# Patient Record
Sex: Male | Born: 1970 | Race: White | Hispanic: No | Marital: Married | Smoking: Current every day smoker
Health system: Southern US, Community
[De-identification: ages and names within clinical notes are randomized; demographics above are authoritative.]

## PROBLEM LIST (undated history)

## (undated) DIAGNOSIS — I2510 Atherosclerotic heart disease of native coronary artery without angina pectoris: Secondary | ICD-10-CM

## (undated) DIAGNOSIS — I10 Essential (primary) hypertension: Secondary | ICD-10-CM

## (undated) DIAGNOSIS — E669 Obesity, unspecified: Secondary | ICD-10-CM

## (undated) DIAGNOSIS — R7303 Prediabetes: Secondary | ICD-10-CM

## (undated) DIAGNOSIS — K121 Other forms of stomatitis: Secondary | ICD-10-CM

## (undated) HISTORY — PX: LEG SURGERY: SHX1003

---

## 2017-06-22 ENCOUNTER — Emergency Department (HOSPITAL_BASED_OUTPATIENT_CLINIC_OR_DEPARTMENT_OTHER): Payer: Self-pay

## 2017-06-22 ENCOUNTER — Encounter (HOSPITAL_BASED_OUTPATIENT_CLINIC_OR_DEPARTMENT_OTHER): Payer: Self-pay | Admitting: Emergency Medicine

## 2017-06-22 ENCOUNTER — Observation Stay (HOSPITAL_BASED_OUTPATIENT_CLINIC_OR_DEPARTMENT_OTHER)
Admission: EM | Admit: 2017-06-22 | Discharge: 2017-06-24 | Disposition: A | Discharge status: Home / Self Care | Payer: Self-pay | Attending: Cardiovascular Disease | Admitting: Cardiovascular Disease

## 2017-06-22 DIAGNOSIS — I2510 Atherosclerotic heart disease of native coronary artery without angina pectoris: Secondary | ICD-10-CM

## 2017-06-22 DIAGNOSIS — E669 Obesity, unspecified: Secondary | ICD-10-CM | POA: Insufficient documentation

## 2017-06-22 DIAGNOSIS — J9811 Atelectasis: Secondary | ICD-10-CM | POA: Insufficient documentation

## 2017-06-22 DIAGNOSIS — I10 Essential (primary) hypertension: Secondary | ICD-10-CM

## 2017-06-22 DIAGNOSIS — I200 Unstable angina: Secondary | ICD-10-CM

## 2017-06-22 DIAGNOSIS — I25110 Atherosclerotic heart disease of native coronary artery with unstable angina pectoris: Principal | ICD-10-CM | POA: Insufficient documentation

## 2017-06-22 DIAGNOSIS — Z8249 Family history of ischemic heart disease and other diseases of the circulatory system: Secondary | ICD-10-CM | POA: Insufficient documentation

## 2017-06-22 DIAGNOSIS — R079 Chest pain, unspecified: Secondary | ICD-10-CM | POA: Diagnosis present

## 2017-06-22 DIAGNOSIS — Z720 Tobacco use: Secondary | ICD-10-CM

## 2017-06-22 DIAGNOSIS — D751 Secondary polycythemia: Secondary | ICD-10-CM

## 2017-06-22 DIAGNOSIS — Z6835 Body mass index (BMI) 35.0-35.9, adult: Secondary | ICD-10-CM | POA: Insufficient documentation

## 2017-06-22 DIAGNOSIS — F17210 Nicotine dependence, cigarettes, uncomplicated: Secondary | ICD-10-CM | POA: Insufficient documentation

## 2017-06-22 DIAGNOSIS — R7303 Prediabetes: Secondary | ICD-10-CM | POA: Insufficient documentation

## 2017-06-22 DIAGNOSIS — E785 Hyperlipidemia, unspecified: Secondary | ICD-10-CM

## 2017-06-22 DIAGNOSIS — I2584 Coronary atherosclerosis due to calcified coronary lesion: Secondary | ICD-10-CM | POA: Insufficient documentation

## 2017-06-22 HISTORY — DX: Obesity, unspecified: E66.9

## 2017-06-22 HISTORY — DX: Atherosclerotic heart disease of native coronary artery without angina pectoris: I25.10

## 2017-06-22 HISTORY — DX: Other forms of stomatitis: K12.1

## 2017-06-22 HISTORY — DX: Prediabetes: R73.03

## 2017-06-22 HISTORY — DX: Essential (primary) hypertension: I10

## 2017-06-22 LAB — HEPATIC FUNCTION PANEL
ALT: 49 U/L (ref 17–63)
AST: 35 U/L (ref 15–41)
Albumin: 4.3 g/dL (ref 3.5–5.0)
Alkaline Phosphatase: 70 U/L (ref 38–126)
BILIRUBIN DIRECT: 0.2 mg/dL (ref 0.1–0.5)
BILIRUBIN TOTAL: 1.2 mg/dL (ref 0.3–1.2)
Indirect Bilirubin: 1 mg/dL — ABNORMAL HIGH (ref 0.3–0.9)
Total Protein: 8.6 g/dL — ABNORMAL HIGH (ref 6.5–8.1)

## 2017-06-22 LAB — CBC
HEMATOCRIT: 50.7 % (ref 39.0–52.0)
Hemoglobin: 18.5 g/dL — ABNORMAL HIGH (ref 13.0–17.0)
MCH: 35.2 pg — ABNORMAL HIGH (ref 26.0–34.0)
MCHC: 36.5 g/dL — AB (ref 30.0–36.0)
MCV: 96.4 fL (ref 78.0–100.0)
Platelets: 171 10*3/uL (ref 150–400)
RBC: 5.26 MIL/uL (ref 4.22–5.81)
RDW: 12.1 % (ref 11.5–15.5)
WBC: 7.4 10*3/uL (ref 4.0–10.5)

## 2017-06-22 LAB — TROPONIN I: Troponin I: <0.03 ng/mL (ref <0.03)

## 2017-06-22 LAB — BASIC METABOLIC PANEL
Anion gap: 12 (ref 5–15)
BUN: 6 mg/dL (ref 6–20)
CALCIUM: 8.8 mg/dL — AB (ref 8.9–10.3)
CO2: 22 mmol/L (ref 22–32)
Chloride: 100 mmol/L — ABNORMAL LOW (ref 101–111)
Creatinine, Ser: 0.8 mg/dL (ref 0.61–1.24)
GFR calc Af Amer: >60 mL/min (ref >60)
GLUCOSE: 107 mg/dL — AB (ref 65–99)
Potassium: 3.4 mmol/L — ABNORMAL LOW (ref 3.5–5.1)
Sodium: 134 mmol/L — ABNORMAL LOW (ref 135–145)

## 2017-06-22 LAB — LIPASE, BLOOD: Lipase: 35 U/L (ref 11–51)

## 2017-06-22 MED ORDER — ACETAMINOPHEN 325 MG PO TABS: 650.0000 mg | ORAL_TABLET | ORAL | Status: DC | PRN

## 2017-06-22 MED ORDER — MORPHINE SULFATE (PF) 2 MG/ML IV SOLN
2.0000 mg | Freq: Once | INTRAVENOUS | Status: AC
  Administered 2017-06-22: 2 mg via INTRAVENOUS
  Filled 2017-06-22: qty 1

## 2017-06-22 MED ORDER — SODIUM CHLORIDE 0.9 % IV SOLN
INTRAVENOUS | Status: DC
  Administered 2017-06-23: 06:00:00 via INTRAVENOUS

## 2017-06-22 MED ORDER — BOOST / RESOURCE BREEZE PO LIQD
1.0000 | Freq: Three times a day (TID) | ORAL | Status: DC
  Administered 2017-06-23 – 2017-06-24 (×2): 1 via ORAL

## 2017-06-22 MED ORDER — NITROGLYCERIN 0.4 MG SL SUBL: 0.4000 mg | SUBLINGUAL_TABLET | SUBLINGUAL | Status: DC | PRN

## 2017-06-22 MED ORDER — ONDANSETRON HCL 4 MG/2ML IJ SOLN
4.0000 mg | Freq: Once | INTRAMUSCULAR | Status: AC
  Administered 2017-06-22: 4 mg via INTRAVENOUS

## 2017-06-22 MED ORDER — SODIUM CHLORIDE 0.9 % IV SOLN: 250.0000 mL | INTRAVENOUS | Status: DC | PRN

## 2017-06-22 MED ORDER — ENOXAPARIN SODIUM 40 MG/0.4ML ~~LOC~~ SOLN: 40.0000 mg | SUBCUTANEOUS | Status: DC

## 2017-06-22 MED ORDER — ONDANSETRON HCL 4 MG/2ML IJ SOLN
INTRAMUSCULAR | Status: AC
  Administered 2017-06-22: 4 mg via INTRAVENOUS
  Filled 2017-06-22: qty 2

## 2017-06-22 MED ORDER — ASPIRIN 81 MG PO CHEW
324.0000 mg | CHEWABLE_TABLET | Freq: Once | ORAL | Status: AC
  Administered 2017-06-22: 324 mg via ORAL

## 2017-06-22 MED ORDER — SODIUM CHLORIDE 0.9% FLUSH: 3.0000 mL | INTRAVENOUS | Status: DC | PRN

## 2017-06-22 MED ORDER — SODIUM CHLORIDE 0.9% FLUSH
3.0000 mL | Freq: Two times a day (BID) | INTRAVENOUS | Status: DC
  Administered 2017-06-22 – 2017-06-23 (×2): 3 mL via INTRAVENOUS

## 2017-06-22 MED ORDER — POTASSIUM CHLORIDE CRYS ER 20 MEQ PO TBCR
40.0000 meq | EXTENDED_RELEASE_TABLET | Freq: Once | ORAL | Status: AC
  Administered 2017-06-22: 40 meq via ORAL
  Filled 2017-06-22: qty 2

## 2017-06-22 MED ORDER — ASPIRIN 81 MG PO CHEW
CHEWABLE_TABLET | ORAL | Status: AC
  Administered 2017-06-22: 324 mg via ORAL
  Filled 2017-06-22: qty 4

## 2017-06-22 MED ORDER — ASPIRIN 81 MG PO CHEW
81.0000 mg | CHEWABLE_TABLET | ORAL | Status: AC
  Administered 2017-06-23: 81 mg via ORAL
  Filled 2017-06-22: qty 1

## 2017-06-22 MED ORDER — METOPROLOL TARTRATE 12.5 MG HALF TABLET
12.5000 mg | ORAL_TABLET | Freq: Two times a day (BID) | ORAL | Status: DC
  Administered 2017-06-22 – 2017-06-24 (×4): 12.5 mg via ORAL
  Filled 2017-06-22 (×4): qty 1

## 2017-06-22 MED ORDER — ASPIRIN EC 81 MG PO TBEC
81.0000 mg | DELAYED_RELEASE_TABLET | Freq: Every day | ORAL | Status: DC
  Administered 2017-06-24: 81 mg via ORAL
  Filled 2017-06-22: qty 1

## 2017-06-22 MED ORDER — ONDANSETRON HCL 4 MG/2ML IJ SOLN: 4.0000 mg | Freq: Four times a day (QID) | INTRAMUSCULAR | Status: DC | PRN

## 2017-06-22 NOTE — ED Provider Notes (Signed)
[facility] Provider Note   CSN: [id number] Arrival date & time: 06/22/17  1031     History   Chief Complaint Chief Complaint  Patient presents with  . Chest Pain    HPI Edward Powers is a 46 y.o. male with history of HTN who presents today with chief complaint acute onset, constant chest pain which began 20 minutes PTA. Patient states that he awoke with sudden onset, severe chest pain which he describes as "someone sitting on my chest". Pain also radiates down the left arm. No aggravating or alleviating factors noted. Also endorses nausea, lightheadedness, and shortness of breath but denies syncope, cough, or vomiting. States he has no known significant cardiac history him a but smokes 1 pack per day. Denies significant alcohol or recreational drug use. Has not tried anything for his symptoms. Denies numbness, tingling, or weakness. Denies recent travel or surgeries, testosterone replacement therapy, hemoptysis, or prior history of DVT/PE.  The history is provided by the patient.    Past Medical History:  Diagnosis Date  . Borderline diabetes   . Hypertension   . Obesity   . Ulcer (traumatic) of oral mucosa     Patient Active Problem List   Diagnosis Date Noted  . Chest pain 06/22/2017    Past Surgical History:  Procedure Laterality Date  . LEG SURGERY         Home Medications    Prior to Admission medications   Not on File    Family History No family history on file.  Social History Social History  Substance Use Topics  . Smoking status: Current Every Day Smoker    Packs/day: 1.00  . Smokeless tobacco: Never Used  . Alcohol use Yes     Comment: Weekend     Allergies   Patient has no known allergies.   Review of Systems Review of Systems  Constitutional: Negative for chills and fever.  Respiratory: Positive for chest tightness and shortness of breath. Negative for cough.   Cardiovascular: Positive for chest pain. Negative for  palpitations and leg swelling.  Gastrointestinal: Positive for nausea. Negative for abdominal pain and vomiting.  Neurological: Positive for light-headedness. Negative for syncope, weakness and numbness.  All other systems reviewed and are negative.    Physical Exam Updated Vital Signs BP 139/63   Pulse 69   Temp 98.2 F (36.8 C) (Oral)   Resp 20   Ht 5\' 6"  (1.676 m)   Wt 100.7 kg (222 lb)   SpO2 94%   BMI 35.83 kg/m   Physical Exam  Constitutional: He is oriented to person, place, and time. He appears well-developed and well-nourished. No distress.  HENT:  Head: Normocephalic and atraumatic.  Right Ear: External ear normal.  Left Ear: External ear normal.  Eyes: Pupils are equal, round, and reactive to light. Conjunctivae and EOM are normal. Right eye exhibits no discharge. Left eye exhibits no discharge. No scleral icterus.  Neck: Normal range of motion. Neck supple. No JVD present. No tracheal deviation present. No thyromegaly present.  Cardiovascular: Normal rate, regular rhythm, normal heart sounds and intact distal pulses.  Exam reveals no gallop and no friction rub.   No murmur heard. 2+ radial and DP/PT pulses bl, negative Homan's bl   Pulmonary/Chest: Breath sounds normal. He has no wheezes. He has no rales. He exhibits no tenderness.  Globally diminished breath sounds  Abdominal: Soft. Bowel sounds are normal. He exhibits no distension. There is no tenderness.  Musculoskeletal: Normal range of  motion. He exhibits tenderness. He exhibits no deformity.  Generalized tenderness to palpation of the left upper extremity. No deformity, crepitus, erythema, or swelling noted. 5/5 strength of BUE major muscle groups and good grip strength  Neurological: He is alert and oriented to person, place, and time. A sensory deficit is present. No cranial nerve deficit.  Fluent speech, no facial droop, cranial nerves III through XII tested and intact. Altered sensation of the left upper  extremity  Skin: Skin is warm. Capillary refill takes less than 2 seconds. No rash noted. He is diaphoretic. No erythema. No pallor.  Psychiatric: He has a normal mood and affect. His behavior is normal.  Nursing note and vitals reviewed.    ED Treatments / Results  Labs (all labs ordered are listed, but only abnormal results are displayed) Labs Reviewed  BASIC METABOLIC PANEL - Abnormal; Notable for the following:       Result Value   Sodium 134 (*)    Potassium 3.4 (*)    Chloride 100 (*)    Glucose, Bld 107 (*)    Calcium 8.8 (*)    All other components within normal limits  CBC - Abnormal; Notable for the following:    Hemoglobin 18.5 (*)    MCH 35.2 (*)    MCHC 36.5 (*)    All other components within normal limits  HEPATIC FUNCTION PANEL - Abnormal; Notable for the following:    Total Protein 8.6 (*)    Indirect Bilirubin 1.0 (*)    All other components within normal limits  TROPONIN I  TROPONIN I  LIPASE, BLOOD    EKG  EKG Interpretation  Date/Time:  Tuesday June 22 2017 10:38:50 EDT Ventricular Rate:  95 PR Interval:    QRS Duration: 86 QT Interval:  390 QTC Calculation: 491 R Axis:   -28 Text Interpretation:  Sinus rhythm Probable left atrial enlargement Borderline left axis deviation Anteroseptal infarct, old Nonspecific T abnormalities, lateral leads Confirmed by Geoffery Lyons ([id number]) on 06/22/2017 10:41:04 AM       Radiology Dg Chest 2 View  Result Date: 06/22/2017 CLINICAL DATA:  Chest pain.  Shortness of breath . EXAM: CHEST  2 VIEW COMPARISON:  No recent prior . FINDINGS: Mild prominence of the anterior mediastinum noted on lateral view, this may be related to patient rotation on lateral view. Low lung volumes with mild bibasilar subsegmental atelectasis. Mild bibasilar infiltrates cannot be excluded. No pleural effusion or pneumothorax. Heart size normal. Degenerative changes thoracic spine . IMPRESSION: 1. Low lung volumes with mild bibasilar  subsegmental atelectasis. Mild bibasilar infiltrates cannot be excluded . 2. Mild prominence of the anterior mediastinum noted on lateral view. This may be related to patient rotation on lateral view. Repeat PA and lateral chest x-ray suggested. Electronically Signed   By: Maisie Fus  Register   On: 06/22/2017 11:23    Procedures Procedures (including critical care time)  Medications Ordered in ED Medications  aspirin chewable tablet 324 mg (324 mg Oral Given 06/22/17 1058)  morphine 2 MG/ML injection 2 mg (2 mg Intravenous Given 06/22/17 1102)  ondansetron (ZOFRAN) injection 4 mg (4 mg Intravenous Given 06/22/17 1210)  morphine 2 MG/ML injection 2 mg (2 mg Intravenous Given 06/22/17 1243)     Initial Impression / Assessment and Plan / ED Course  I have reviewed the triage vital signs and the nursing notes.  Pertinent labs & imaging results that were available during my care of the patient were reviewed by me and considered  in my medical decision making (see chart for details).     Patient with acute onset of chest pain with radiation down the left arm 20 minutes PTA. Afebrile, vital signs are stable. EKG shows nonspecific T-wave abnormalities in the lateral leads with no comparison. Chest x-ray reviewed by me shows low lung volumes with mild bibasilar subsegmental atelectasis. Troponin negative, no leukocytosis and no significant electrolyte abnormalities. Low suspicion of pneumonia, PE, bronchitis, or pericarditis. Pain managed while in the ED with ASA, morphine and Zofran. Given suspicious history, T-wave abnormalities, and HEART score of 5, will consult cardiology. Spoke with cardiologist Dr. Molli Posey will assume care of the patient, and bring him in to the hospital for further evaluation. He is stable for telemetry bed.  Final Clinical Impressions(s) / ED Diagnoses   Final diagnoses:  Chest pain, unspecified type    New Prescriptions New Prescriptions   No medications on file       Bennye Alm 06/22/17 1551    Geoffery Lyons, MD 06/24/17 [phone]

## 2017-06-22 NOTE — ED Triage Notes (Addendum)
Pt reports chest pain upon waking pta. States he was sweaty and felt like he was going to pass out. States "it feels like someone sitting on me" and pain also goes down left arm

## 2017-06-22 NOTE — ED Notes (Signed)
Family at bedside. 

## 2017-06-22 NOTE — H&P (Signed)
History & Physical    Patient ID: Edward Powers MRN: [id number], DOB/AGE: Jun 08, 1971   Admit date: 06/22/2017  Primary Physician: Patient, No Pcp Per Primary Cardiologist: New to [facility] - Dr. Royann Shivers   History of Present Illness    Edward Powers is a 46 y.o. male with past medical history of HTN and tobacco use who presented to [facility] on 06/22/2017 for evaluation of chest pain.   The patient reports awaking from sleep around 0900 due to sudden onset left-sided chest pain which radiated down his left arm. Says it felt like something was sitting on his chest. Developed associated dyspnea and diaphoresis at that time. Upon arrival to [facility], he was given SL NTG and IV Morphine with improvement of his symptoms. Reports a mild pain at this time which is reproducible on palpation and says this clearly feels different from what he experienced this morning.   He is not overly active at baseline but reports performing household chores over the past few weeks without any anginal symptoms. Did have strep throat approximately one month ago which was diagnosed with a swab and appropriately treated with antibiotic therapy.   He denies any known history of CAD or prior MI's. Reports BP is always "borderline-high" but de does not take medications for this. Unsure of any diagnosis of HLD or Type 2 DM. Reports his paternal uncle had a massive MI in his 52's. The patient does smoke 1 ppd and has done so for 30+ years. Consumes 3-4 beers a few nights per week, mostly on weekends. He denies any recreational drug use. 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   Initial labs show WBC of 7.4. Hgb 18.5, platelets 171. Na+ 134, K+ 3.4, creatinine 0.80. LFT's are within normal limits. Initial and delta troponin have been negative. CXR with low lung volumes with bibasilar atelectasis, infiltrates not excluded. Mild prominence of the anterior mediastinum noted. EKG shows NSR, HR 95, with TWI  along AVL and V6  (no prior tracings available for comparison).   Past Medical History    Past Medical History:  Diagnosis Date  . Borderline diabetes   . Hypertension   . Obesity   . Ulcer (traumatic) of oral mucosa     Past Surgical History:  Procedure Laterality Date  . LEG SURGERY       Allergies  No Known Allergies   Home Medications    Prior to Admission medications   Not on File    Family History    Family History  Problem Relation Age of Onset  . Diabetes Mellitus II Father   . CAD Paternal Uncle        Initial MI in his 71's    Social History    Social History   Social History  . Marital status: Married    Spouse name: N/A  . Number of children: N/A  . Years of education: N/A   Occupational History  . Not on file.   Social History Main Topics  . Smoking status: Current Every Day Smoker    Packs/day: 1.00    Years: 30.00  . Smokeless tobacco: Never Used  . Alcohol use 12.0 oz/week    20 Cans of beer per week  . Drug use: No  . Sexual activity: Not on file   Other Topics Concern  . Not on file   Social History Narrative  . No narrative on file  Review of Systems    General:  No chills, fever, night sweats or weight changes.  Cardiovascular:  No dyspnea on exertion, edema, orthopnea, palpitations, paroxysmal nocturnal dyspnea. Positive for chest pain.  Dermatological: No rash, lesions/masses Respiratory: No cough, dyspnea Urologic: No hematuria, dysuria Abdominal:   No nausea, vomiting, diarrhea, bright red blood per rectum, melena, or hematemesis Neurologic:  No visual changes, wkns, changes in mental status. All other systems reviewed and are otherwise negative except as noted above.  Physical Exam    Blood pressure (!) 142/72, pulse 65, temperature 98.4 F (36.9 C), temperature source Oral, resp. rate 18, height 5\' 6"  (1.676 m), weight 206 lb 4.8 oz (93.6 kg), SpO2 98 %.  General: Well developed, well nourished Caucasian  male appearing in no acute distress. Head: Normocephalic, atraumatic, sclera non-icteric, no xanthomas, nares are without discharge. Neck: No carotid bruits. JVD not elevated.  Lungs: Respirations regular and unlabored, without wheezes or rales.  Heart: Regular rate and rhythm. No S3 or S4.  No murmur, no rubs, or gallops appreciated. Abdomen: Soft, non-tender, non-distended with normoactive bowel sounds. No hepatomegaly. No rebound/guarding. No obvious abdominal masses. Msk:  Strength and tone appear normal for age. No joint deformities or effusions. Extremities: No clubbing or cyanosis. No lower extremity edema.  Distal pedal pulses are 2+ bilaterally. Neuro: Alert and oriented X 3. Moves all extremities spontaneously. No focal deficits noted. Psych:  Responds to questions appropriately with a normal affect. Skin: No rashes or lesions noted  Labs    Troponin (Point of Care Test) No results for input(s): TROPIPOC in the last 72 hours.  Recent Labs  06/22/17 1040 06/22/17 1344  TROPONINI <0.03 <0.03   Lab Results  Component Value Date   WBC 7.4 06/22/2017   HGB 18.5 (H) 06/22/2017   HCT 50.7 06/22/2017   MCV 96.4 06/22/2017   PLT 171 06/22/2017     Recent Labs Lab 06/22/17 1040  NA 134*  K 3.4*  CL 100*  CO2 22  BUN 6  CREATININE 0.80  CALCIUM 8.8*  PROT 8.6*  BILITOT 1.2  ALKPHOS 70  ALT 49  AST 35  GLUCOSE 107*   No results found for: CHOL, HDL, LDLCALC, TRIG No results found for: DDIMER  No results found for: BNP No results found for: PROBNP No results for input(s): INR in the last 72 hours.    Radiology Studies    Dg Chest 2 View  Result Date: 06/22/2017 CLINICAL DATA:  Chest pain.  Shortness of breath . EXAM: CHEST  2 VIEW COMPARISON:  No recent prior . FINDINGS: Mild prominence of the anterior mediastinum noted on lateral view, this may be related to patient rotation on lateral view. Low lung volumes with mild bibasilar subsegmental atelectasis. Mild  bibasilar infiltrates cannot be excluded. No pleural effusion or pneumothorax. Heart size normal. Degenerative changes thoracic spine . IMPRESSION: 1. Low lung volumes with mild bibasilar subsegmental atelectasis. Mild bibasilar infiltrates cannot be excluded . 2. Mild prominence of the anterior mediastinum noted on lateral view. This may be related to patient rotation on lateral view. Repeat PA and lateral chest x-ray suggested. Electronically Signed   By: Maisie Fus  Register   On: 06/22/2017 11:23    EKG & Cardiac Imaging    EKG:  NSR, HR 95, with TWI along AVL and V6  (no prior tracings available for comparison) - Personally Reviewed  ECHOCARDIOGRAM: None on File  Assessment & Plan   1. Chest Pain with Mixed Typical and  Atypical Features - was awoke from sleep around 0900 this morning due to sudden onset left-sided chest pain which radiated down his left arm. Says it felt like something was sitting on his chest and he reports associated dyspnea and diaphoresis.  - reports mild pain at this time which is clearly reproducible on palpation but says this is markedly different from his symptoms this morning.  - cardiac risk factors include HTN, tobacco use, and family history of CAD. 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  - Initial and delta troponin have been negative. EKG shows NSR, HR 95, with TWI along AVL and V6  (no prior tracings available for comparison).  - Discussed with Dr. Royann Shiversroitoru and with his presenting symptoms and abnormal EKG, a cardiac catheterization would be most beneficial. The patient understands that risks include but are not limited to stroke (1 in 1000), death (1 in 1000), kidney failure [usually temporary] (1 in 500), bleeding (1 in 200), allergic reaction [possibly serious] (1 in 200). Clear liquid breakfast in AM and then NPO. Has been placed on the add-on board.   2. HTN - BP elevated at  142/72 on most recent check.  - started on Lopressor 12.5mg  BID. Consider switching to once daily medication such as Amlodipine for improved compliance if no CAD is noted.   3. Tobacco Use - cessation advised.    Signed, Ellsworth LennoxBrittany M Strader, PA-C 06/22/2017, 6:54 PM Pager: [phone]  I have seen and examined the patient along with Ellsworth LennoxBrittany M Strader, PA-C.  I have reviewed the chart, notes and new data.  I agree with PA's note.  Key new complaints: symptoms are strongly suggestive of unstable angina; also describes a separate discomfort in both arms, c/w cervical radiculopathy. He is a loud and long time snorer, but denies daytime hypersomnolence. Key examination changes: obese, crowded airway, no carotid bruits, normal distal pulses, RRR, questionable S4, no murmurs Key new findings / data: horizontal ST depression and T wave inversion in lateral leads without other signs of LVH., normal cardiac enzymes, Hgb 18.5  PLAN: Discussed options for invasive versus noninvasive evaluation for CAD and discussed pros and cons. He prefers direct angiography and I think this is reasonable. This procedure and possible PCI-stent has been fully reviewed with the patient and written informed consent has been obtained. Will need to explore possible diagnoses of COPD or OSA as explanations for his polycythemia. Beta blockers for HBP. Review importance of smoking cessation and weight loss. Check lipids.   Thurmon FairMihai Nadean Montanaro, MD, [facility] [phone] 06/22/2017, 7:11 PM

## 2017-06-22 NOTE — ED Notes (Signed)
ekg given to Dr. Judd Lienelo to review

## 2017-06-22 NOTE — ED Notes (Signed)
Patient denies pain and is resting comfortably.  

## 2017-06-22 NOTE — ED Notes (Signed)
ED Provider at bedside. 

## 2017-06-23 ENCOUNTER — Encounter (HOSPITAL_COMMUNITY)
Admission: EM | Disposition: A | Discharge status: Home / Self Care | Payer: Self-pay | Source: Home / Self Care | Attending: Emergency Medicine

## 2017-06-23 DIAGNOSIS — F17200 Nicotine dependence, unspecified, uncomplicated: Secondary | ICD-10-CM

## 2017-06-23 DIAGNOSIS — E786 Lipoprotein deficiency: Secondary | ICD-10-CM

## 2017-06-23 DIAGNOSIS — I200 Unstable angina: Secondary | ICD-10-CM

## 2017-06-23 DIAGNOSIS — I25110 Atherosclerotic heart disease of native coronary artery with unstable angina pectoris: Secondary | ICD-10-CM

## 2017-06-23 DIAGNOSIS — I10 Essential (primary) hypertension: Secondary | ICD-10-CM

## 2017-06-23 HISTORY — PX: LEFT HEART CATH AND CORONARY ANGIOGRAPHY: CATH118249

## 2017-06-23 LAB — LIPID PANEL
CHOLESTEROL: 130 mg/dL (ref 0–200)
HDL: 31 mg/dL — AB (ref >40)
LDL CALC: 68 mg/dL (ref 0–99)
TRIGLYCERIDES: 157 mg/dL — AB (ref <150)
Total CHOL/HDL Ratio: 4.2 RATIO
VLDL: 31 mg/dL (ref 0–40)

## 2017-06-23 LAB — BASIC METABOLIC PANEL
Anion gap: 7 (ref 5–15)
BUN: 10 mg/dL (ref 6–20)
CALCIUM: 8.9 mg/dL (ref 8.9–10.3)
CO2: 26 mmol/L (ref 22–32)
CREATININE: 0.98 mg/dL (ref 0.61–1.24)
Chloride: 102 mmol/L (ref 101–111)
GFR calc Af Amer: >60 mL/min (ref >60)
GFR calc non Af Amer: >60 mL/min (ref >60)
GLUCOSE: 130 mg/dL — AB (ref 65–99)
Potassium: 3.8 mmol/L (ref 3.5–5.1)
Sodium: 135 mmol/L (ref 135–145)

## 2017-06-23 LAB — PROTIME-INR
INR: 1.07
Prothrombin Time: 13.9 seconds (ref 11.4–15.2)

## 2017-06-23 LAB — POCT ACTIVATED CLOTTING TIME: ACTIVATED CLOTTING TIME: 257 s

## 2017-06-23 LAB — TROPONIN I: Troponin I: <0.03 ng/mL (ref <0.03)

## 2017-06-23 SURGERY — LEFT HEART CATH AND CORONARY ANGIOGRAPHY
Anesthesia: LOCAL

## 2017-06-23 MED ORDER — ADENOSINE (DIAGNOSTIC) 140MCG/KG/MIN
INTRAVENOUS | Status: AC | PRN
Start: 2017-06-23 — End: 2017-06-23
  Administered 2017-06-23: 140 ug/kg/min via INTRAVENOUS

## 2017-06-23 MED ORDER — FENTANYL CITRATE (PF) 100 MCG/2ML IJ SOLN
INTRAMUSCULAR | Status: DC | PRN
  Administered 2017-06-23: 50 ug via INTRAVENOUS

## 2017-06-23 MED ORDER — VERAPAMIL HCL 2.5 MG/ML IV SOLN
INTRAVENOUS | Status: DC | PRN
  Administered 2017-06-23: 10 mL via INTRA_ARTERIAL

## 2017-06-23 MED ORDER — SODIUM CHLORIDE 0.9 % IV SOLN: INTRAVENOUS | Status: AC

## 2017-06-23 MED ORDER — SODIUM CHLORIDE 0.9 % IV SOLN: 250.0000 mL | INTRAVENOUS | Status: DC | PRN

## 2017-06-23 MED ORDER — MIDAZOLAM HCL 2 MG/2ML IJ SOLN
INTRAMUSCULAR | Status: AC
  Filled 2017-06-23: qty 2

## 2017-06-23 MED ORDER — LIDOCAINE HCL (PF) 1 % IJ SOLN
INTRAMUSCULAR | Status: DC | PRN
  Administered 2017-06-23: 4 mL via SUBCUTANEOUS

## 2017-06-23 MED ORDER — MIDAZOLAM HCL 2 MG/2ML IJ SOLN
INTRAMUSCULAR | Status: DC | PRN
  Administered 2017-06-23: 1 mg via INTRAVENOUS

## 2017-06-23 MED ORDER — LIDOCAINE HCL (PF) 1 % IJ SOLN
INTRAMUSCULAR | Status: AC
  Filled 2017-06-23: qty 30

## 2017-06-23 MED ORDER — ATORVASTATIN CALCIUM 40 MG PO TABS
40.0000 mg | ORAL_TABLET | Freq: Every day | ORAL | Status: DC
  Administered 2017-06-23: 40 mg via ORAL
  Filled 2017-06-23: qty 1

## 2017-06-23 MED ORDER — FENTANYL CITRATE (PF) 100 MCG/2ML IJ SOLN
INTRAMUSCULAR | Status: AC
  Filled 2017-06-23: qty 2

## 2017-06-23 MED ORDER — IOPAMIDOL (ISOVUE-370) INJECTION 76%
INTRAVENOUS | Status: DC | PRN
  Administered 2017-06-23: 100 mL via INTRA_ARTERIAL

## 2017-06-23 MED ORDER — VERAPAMIL HCL 2.5 MG/ML IV SOLN
INTRAVENOUS | Status: AC
  Filled 2017-06-23: qty 2

## 2017-06-23 MED ORDER — IOPAMIDOL (ISOVUE-370) INJECTION 76%
INTRAVENOUS | Status: AC
  Filled 2017-06-23: qty 100

## 2017-06-23 MED ORDER — HEPARIN (PORCINE) IN NACL 2-0.9 UNIT/ML-% IJ SOLN
INTRAMUSCULAR | Status: AC
  Filled 2017-06-23: qty 1000

## 2017-06-23 MED ORDER — HEPARIN (PORCINE) IN NACL 2-0.9 UNIT/ML-% IJ SOLN
INTRAMUSCULAR | Status: AC | PRN
  Administered 2017-06-23: 1000 mL

## 2017-06-23 MED ORDER — ADENOSINE 12 MG/4ML IV SOLN
INTRAVENOUS | Status: AC
  Filled 2017-06-23: qty 16

## 2017-06-23 MED ORDER — SODIUM CHLORIDE 0.9% FLUSH: 3.0000 mL | INTRAVENOUS | Status: DC | PRN

## 2017-06-23 MED ORDER — MORPHINE SULFATE (PF) 10 MG/ML IV SOLN: 2.0000 mg | Freq: Once | INTRAVENOUS | Status: AC

## 2017-06-23 MED ORDER — HEPARIN SODIUM (PORCINE) 1000 UNIT/ML IJ SOLN
INTRAMUSCULAR | Status: DC | PRN
  Administered 2017-06-23: 5000 [IU] via INTRAVENOUS
  Administered 2017-06-23: 3000 [IU] via INTRAVENOUS

## 2017-06-23 MED ORDER — MORPHINE SULFATE (PF) 2 MG/ML IV SOLN
INTRAVENOUS | Status: AC
  Administered 2017-06-23: 2 mg via INTRAVENOUS
  Filled 2017-06-23: qty 1

## 2017-06-23 MED ORDER — HEPARIN SODIUM (PORCINE) 1000 UNIT/ML IJ SOLN
INTRAMUSCULAR | Status: AC
  Filled 2017-06-23: qty 1

## 2017-06-23 MED ORDER — SODIUM CHLORIDE 0.9% FLUSH
3.0000 mL | Freq: Two times a day (BID) | INTRAVENOUS | Status: DC
  Administered 2017-06-23 – 2017-06-24 (×2): 3 mL via INTRAVENOUS

## 2017-06-23 SURGICAL SUPPLY — 11 items
CATH OPTITORQUE JACKY 4.0 5F (CATHETERS) ×2 IMPLANT
CATH VISTA GUIDE 6FR JR4 (CATHETERS) ×2 IMPLANT
DEVICE RAD COMP TR BAND LRG (VASCULAR PRODUCTS) ×2 IMPLANT
GLIDESHEATH SLEND SS 6F .021 (SHEATH) ×2 IMPLANT
GUIDEWIRE INQWIRE 1.5J.035X260 (WIRE) ×1 IMPLANT
GUIDEWIRE PRESSURE COMET II (WIRE) ×2 IMPLANT
INQWIRE 1.5J .035X260CM (WIRE) ×2
KIT HEART LEFT (KITS) ×2 IMPLANT
PACK CARDIAC CATHETERIZATION (CUSTOM PROCEDURE TRAY) ×2 IMPLANT
TRANSDUCER W/STOPCOCK (MISCELLANEOUS) ×2 IMPLANT
TUBING CIL FLEX 10 FLL-RA (TUBING) ×2 IMPLANT

## 2017-06-23 NOTE — Progress Notes (Signed)
Nutrition Brief Note  Patient identified on the Malnutrition Screening Tool (MST) Report  Wt Readings from Last 15 Encounters:  06/23/17 207 lb 9.6 oz (94.2 kg)   Pt acknowledges wt loss but reports he began losing wt after making dietary changes (pt stopped drinking regular sodas every day and switched to water)  Body mass index is 33.51 kg/m. Patient meets criteria for obesity unspecified based on current BMI.   Current diet order is NPO for test, pt with very good appetite, eating well at home PTA. patient is consuming approximately 100% of meals at this time. Labs and medications reviewed.   No nutrition interventions warranted at this time. If nutrition issues arise, please consult RD.   Romelle Starcherate Kinlie Janice MS, RD, LDN [phone] Pager  [phone] Weekend/On-Call Pager

## 2017-06-23 NOTE — Progress Notes (Signed)
TR band removed, no complications and tegaderm/gauze placed.

## 2017-06-23 NOTE — Research (Signed)
OPTIMIZE Research Study Informed Consent   Subject Name: Edward Powers  Subject met inclusion and exclusion criteria.  The informed consent form, study requirements and expectations were reviewed with the subject and questions and concerns were addressed prior to the signing of the consent form.  The subject verbalized understanding of the trail requirements.  The subject agreed to participate in the OPTIMIZE trial and signed the informed consent.  The informed consent was obtained prior to performance of any protocol-specific procedures for the subject.  A copy of the signed informed consent was given to the subject and a copy was placed in the subject's medical record.  Blossom Hoops 06/23/2017, 3:59 PM

## 2017-06-23 NOTE — Progress Notes (Signed)
Progress Note  Patient Name: Edward Powers Date of Encounter: 06/23/2017  Primary Cardiologist: New (Tristram Milian)  Subjective   No angina overnight. Enzymes remain normal.  Inpatient Medications    Scheduled Meds: . aspirin EC  81 mg Oral Daily  . enoxaparin (LOVENOX) injection  40 mg Subcutaneous Q24H  . feeding supplement  1 Container Oral TID BM  . metoprolol tartrate  12.5 mg Oral BID  . sodium chloride flush  3 mL Intravenous Q12H   Continuous Infusions: . sodium chloride    . sodium chloride 100 mL/hr at 06/23/17 0538   PRN Meds: sodium chloride, acetaminophen, nitroGLYCERIN, ondansetron (ZOFRAN) IV, sodium chloride flush   Vital Signs    Vitals:   06/22/17 1826 06/22/17 2204 06/23/17 0033 06/23/17 0437  BP: (!) 142/72 115/66 (!) 127/56 124/62  Pulse: 65 66 (!) 55 (!) 58  Resp: 18  20 20   Temp: 98.4 F (36.9 C)  97.7 F (36.5 C) 98.1 F (36.7 C)  TempSrc: Oral  Oral Oral  SpO2: 98%  97% 98%  Weight: 206 lb 4.8 oz (93.6 kg)   207 lb 9.6 oz (94.2 kg)  Height: 5\' 6"  (1.676 m)       Intake/Output Summary (Last 24 hours) at 06/23/17 0830 Last data filed at 06/23/17 0600  Gross per 24 hour  Intake           719.67 ml  Output                0 ml  Net           719.67 ml   Filed Weights   06/22/17 1040 06/22/17 1826 06/23/17 0437  Weight: 222 lb (100.7 kg) 206 lb 4.8 oz (93.6 kg) 207 lb 9.6 oz (94.2 kg)    Telemetry    NSR - Personally Reviewed  ECG    No new tracing - Personally Reviewed  Physical Exam  Alert, comfortable. Obese GEN: No acute distress.   Neck: No JVD Cardiac: RRR, no murmurs, rubs, or gallops.  Respiratory: Clear to auscultation bilaterally. GI: Soft, nontender, non-distended  MS: No edema; No deformity. Neuro:  Nonfocal  Psych: Normal affect   Labs    Chemistry Recent Labs Lab 06/22/17 1040 06/23/17 0333  NA 134* 135  K 3.4* 3.8  CL 100* 102  CO2 22 26  GLUCOSE 107* 130*  BUN 6 10  CREATININE 0.80 0.98  CALCIUM  8.8* 8.9  PROT 8.6*  --   ALBUMIN 4.3  --   AST 35  --   ALT 49  --   ALKPHOS 70  --   BILITOT 1.2  --   GFRNONAA >60 >60  GFRAA >60 >60  ANIONGAP 12 7     Hematology Recent Labs Lab 06/22/17 1040  WBC 7.4  RBC 5.26  HGB 18.5*  HCT 50.7  MCV 96.4  MCH 35.2*  MCHC 36.5*  RDW 12.1  PLT 171    Cardiac Enzymes Recent Labs Lab 06/22/17 1040 06/22/17 1344 06/23/17 0333  TROPONINI <0.03 <0.03 <0.03   No results for input(s): TROPIPOC in the last 168 hours.   BNPNo results for input(s): BNP, PROBNP in the last 168 hours.   DDimer No results for input(s): DDIMER in the last 168 hours.   Radiology    Dg Chest 2 View  Result Date: 06/22/2017 CLINICAL DATA:  Chest pain.  Shortness of breath . EXAM: CHEST  2 VIEW COMPARISON:  No recent prior . FINDINGS: Mild prominence  of the anterior mediastinum noted on lateral view, this may be related to patient rotation on lateral view. Low lung volumes with mild bibasilar subsegmental atelectasis. Mild bibasilar infiltrates cannot be excluded. No pleural effusion or pneumothorax. Heart size normal. Degenerative changes thoracic spine . IMPRESSION: 1. Low lung volumes with mild bibasilar subsegmental atelectasis. Mild bibasilar infiltrates cannot be excluded . 2. Mild prominence of the anterior mediastinum noted on lateral view. This may be related to patient rotation on lateral view. Repeat PA and lateral chest x-ray suggested. Electronically Signed   By: Maisie Fus  Register   On: 06/22/2017 11:23    Patient Profile     46 y.o. male smoker with HTN, with severe chest discomfort at rest, compatible with unstable angina and ECG ST-T changes, but normal biomarkers. Strong family history of early CAD and premature death from heart disease.  Assessment & Plan    1. Unstable angina: for heart cath and possible PCI today. This procedure has been fully reviewed with the patient and written informed consent has been obtained. 2. HTN: better  control. 3. Tobacco: will work on convincing him to quit. 4. Polycythemia: after coronary issues are addressed, workup should include PFTs and a sleep study. 5. Low HDL: other lipid parameters are not bad. Focus on weight loss, more physical exercise and improved diet, reduced intake of carbs.  Signed, Thurmon Fair, MD  06/23/2017, 8:30 AM

## 2017-06-23 NOTE — Progress Notes (Signed)
No complaints of chest pain

## 2017-06-23 NOTE — H&P (View-Only) (Signed)
Progress Note  Patient Name: Edward CuriaMark Powers Date of Encounter: 06/23/2017  Primary Cardiologist: New (Meggan Dhaliwal)  Subjective   No angina overnight. Enzymes remain normal.  Inpatient Medications    Scheduled Meds: . aspirin EC  81 mg Oral Daily  . enoxaparin (LOVENOX) injection  40 mg Subcutaneous Q24H  . feeding supplement  1 Container Oral TID BM  . metoprolol tartrate  12.5 mg Oral BID  . sodium chloride flush  3 mL Intravenous Q12H   Continuous Infusions: . sodium chloride    . sodium chloride 100 mL/hr at 06/23/17 0538   PRN Meds: sodium chloride, acetaminophen, nitroGLYCERIN, ondansetron (ZOFRAN) IV, sodium chloride flush   Vital Signs    Vitals:   06/22/17 1826 06/22/17 2204 06/23/17 0033 06/23/17 0437  BP: (!) 142/72 115/66 (!) 127/56 124/62  Pulse: 65 66 (!) 55 (!) 58  Resp: 18  20 20   Temp: 98.4 F (36.9 C)  97.7 F (36.5 C) 98.1 F (36.7 C)  TempSrc: Oral  Oral Oral  SpO2: 98%  97% 98%  Weight: 206 lb 4.8 oz (93.6 kg)   207 lb 9.6 oz (94.2 kg)  Height: 5\' 6"  (1.676 m)       Intake/Output Summary (Last 24 hours) at 06/23/17 0830 Last data filed at 06/23/17 0600  Gross per 24 hour  Intake           719.67 ml  Output                0 ml  Net           719.67 ml   Filed Weights   06/22/17 1040 06/22/17 1826 06/23/17 0437  Weight: 222 lb (100.7 kg) 206 lb 4.8 oz (93.6 kg) 207 lb 9.6 oz (94.2 kg)    Telemetry    NSR - Personally Reviewed  ECG    No new tracing - Personally Reviewed  Physical Exam  Alert, comfortable. Obese GEN: No acute distress.   Neck: No JVD Cardiac: RRR, no murmurs, rubs, or gallops.  Respiratory: Clear to auscultation bilaterally. GI: Soft, nontender, non-distended  MS: No edema; No deformity. Neuro:  Nonfocal  Psych: Normal affect   Labs    Chemistry Recent Labs Lab 06/22/17 1040 06/23/17 0333  NA 134* 135  K 3.4* 3.8  CL 100* 102  CO2 22 26  GLUCOSE 107* 130*  BUN 6 10  CREATININE 0.80 0.98  CALCIUM  8.8* 8.9  PROT 8.6*  --   ALBUMIN 4.3  --   AST 35  --   ALT 49  --   ALKPHOS 70  --   BILITOT 1.2  --   GFRNONAA >60 >60  GFRAA >60 >60  ANIONGAP 12 7     Hematology Recent Labs Lab 06/22/17 1040  WBC 7.4  RBC 5.26  HGB 18.5*  HCT 50.7  MCV 96.4  MCH 35.2*  MCHC 36.5*  RDW 12.1  PLT 171    Cardiac Enzymes Recent Labs Lab 06/22/17 1040 06/22/17 1344 06/23/17 0333  TROPONINI <0.03 <0.03 <0.03   No results for input(s): TROPIPOC in the last 168 hours.   BNPNo results for input(s): BNP, PROBNP in the last 168 hours.   DDimer No results for input(s): DDIMER in the last 168 hours.   Radiology    Dg Chest 2 View  Result Date: 06/22/2017 CLINICAL DATA:  Chest pain.  Shortness of breath . EXAM: CHEST  2 VIEW COMPARISON:  No recent prior . FINDINGS: Mild prominence  of the anterior mediastinum noted on lateral view, this may be related to patient rotation on lateral view. Low lung volumes with mild bibasilar subsegmental atelectasis. Mild bibasilar infiltrates cannot be excluded. No pleural effusion or pneumothorax. Heart size normal. Degenerative changes thoracic spine . IMPRESSION: 1. Low lung volumes with mild bibasilar subsegmental atelectasis. Mild bibasilar infiltrates cannot be excluded . 2. Mild prominence of the anterior mediastinum noted on lateral view. This may be related to patient rotation on lateral view. Repeat PA and lateral chest x-ray suggested. Electronically Signed   By: Maisie Fus  Register   On: 06/22/2017 11:23    Patient Profile     46 y.o. male smoker with HTN, with severe chest discomfort at rest, compatible with unstable angina and ECG ST-T changes, but normal biomarkers. Strong family history of early CAD and premature death from heart disease.  Assessment & Plan    1. Unstable angina: for heart cath and possible PCI today. This procedure has been fully reviewed with the patient and written informed consent has been obtained. 2. HTN: better  control. 3. Tobacco: will work on convincing him to quit. 4. Polycythemia: after coronary issues are addressed, workup should include PFTs and a sleep study. 5. Low HDL: other lipid parameters are not bad. Focus on weight loss, more physical exercise and improved diet, reduced intake of carbs.  Signed, Thurmon Fair, MD  06/23/2017, 8:30 AM

## 2017-06-23 NOTE — Progress Notes (Signed)
Patient sleeps and snores very loudly. Family at bedside. Patient is aware of procedure tomorrow, wants graham crackers before his clear liquid diet starts at 0500.

## 2017-06-23 NOTE — Interval H&P Note (Signed)
Cath Lab Visit (complete for each Cath Lab visit)  Clinical Evaluation Leading to the Procedure:   ACS: Yes.    Non-ACS:  n/a   History and Physical Interval Note:  06/23/2017 4:06 PM  Simone CuriaMark Encalade  has presented today for surgery, with the diagnosis of unstable angina  The various methods of treatment have been discussed with the patient and family. After consideration of risks, benefits and other options for treatment, the patient has consented to  Procedure(s): Left Heart Cath and Coronary Angiography (N/A) as a surgical intervention .  The patient's history has been reviewed, patient examined, no change in status, stable for surgery.  I have reviewed the patient's chart and labs.  Questions were answered to the patient's satisfaction.     Lorine BearsMuhammad Arida

## 2017-06-23 NOTE — Progress Notes (Signed)
Patient and wife watching cardiac cath/PCI video.

## 2017-06-23 NOTE — Progress Notes (Signed)
Patient back from cath, VSS.  Upon RN reapplying telemetry box patient began c/o pain in his right to mid chest rated at a 3.  States this is worse than the pain he came in with. Notified PA for cards, orders received.

## 2017-06-23 NOTE — Plan of Care (Signed)
Problem: Tissue Perfusion: Goal: Risk factors for ineffective tissue perfusion will decrease Outcome: Progressing Maintains oxygen saturations high 90's on room air

## 2017-06-23 NOTE — Progress Notes (Signed)
Patient denies any further chest pain at this time, VSS.

## 2017-06-24 ENCOUNTER — Encounter (HOSPITAL_COMMUNITY): Payer: Self-pay | Admitting: Cardiovascular Disease

## 2017-06-24 ENCOUNTER — Other Ambulatory Visit: Payer: Self-pay | Admitting: Cardiology

## 2017-06-24 DIAGNOSIS — I10 Essential (primary) hypertension: Secondary | ICD-10-CM

## 2017-06-24 DIAGNOSIS — D751 Secondary polycythemia: Secondary | ICD-10-CM

## 2017-06-24 DIAGNOSIS — I25708 Atherosclerosis of coronary artery bypass graft(s), unspecified, with other forms of angina pectoris: Secondary | ICD-10-CM

## 2017-06-24 DIAGNOSIS — E785 Hyperlipidemia, unspecified: Secondary | ICD-10-CM

## 2017-06-24 DIAGNOSIS — I2510 Atherosclerotic heart disease of native coronary artery without angina pectoris: Secondary | ICD-10-CM

## 2017-06-24 DIAGNOSIS — Z720 Tobacco use: Secondary | ICD-10-CM

## 2017-06-24 DIAGNOSIS — I2583 Coronary atherosclerosis due to lipid rich plaque: Principal | ICD-10-CM

## 2017-06-24 LAB — HEMOGLOBIN A1C
Hgb A1c MFr Bld: 6.2 % — ABNORMAL HIGH (ref 4.8–5.6)
Mean Plasma Glucose: 131 mg/dL

## 2017-06-24 LAB — HIV ANTIBODY (ROUTINE TESTING W REFLEX): HIV SCREEN 4TH GENERATION: NONREACTIVE

## 2017-06-24 MED ORDER — METOPROLOL TARTRATE 25 MG PO TABS: 12.5000 mg | ORAL_TABLET | Freq: Two times a day (BID) | ORAL | 11 refills | Status: AC

## 2017-06-24 MED ORDER — ASPIRIN 81 MG PO TBEC: 81.0000 mg | DELAYED_RELEASE_TABLET | Freq: Every day | ORAL | 11 refills | Status: AC

## 2017-06-24 MED ORDER — ATORVASTATIN CALCIUM 40 MG PO TABS: 40.0000 mg | ORAL_TABLET | Freq: Every day | ORAL | 11 refills | Status: AC

## 2017-06-24 MED ORDER — NITROGLYCERIN 0.4 MG SL SUBL: 0.4000 mg | SUBLINGUAL_TABLET | SUBLINGUAL | 6 refills | Status: AC | PRN

## 2017-06-24 NOTE — Discharge Summary (Signed)
Discharge Summary    Patient ID: Edward Powers,  MRN: [id number], DOB/AGE: 47-20-1972 47 y.o.  Admit date: 06/22/2017 Discharge date: 06/24/2017  Primary Care Provider: No primary care provider on file. Primary Cardiologist: New- Cr Croitoru  Discharge Diagnoses    Active Problems:   Chest pain   Essential (primary) hypertension   Tobacco abuse   Dyslipidemia, goal LDL below 70   Polycythemia   CAD (coronary artery disease)   Allergies No Known Allergies  Diagnostic Studies/Procedures    Left Heart Cath and Coronary Angiography 06/23/17  Conclusion    The left ventricular systolic function is normal.  LV end diastolic pressure is mildly elevated.  The left ventricular ejection fraction is 55-65% by visual estimate.  Prox RCA to Mid RCA lesion, 20 %stenosed.  Mid RCA lesion, 50 %stenosed.  RPDA lesion, 70 %stenosed.  Prox LAD to Mid LAD lesion, 30 %stenosed.  1. Mild to moderate nonobstructive coronary artery disease. The coronary arteries are overall moderately to severely calcified especially the right coronary artery. There is 50% very eccentric mid RCA stenosis which was not significant by pressure wire interrogation with an FFR of 0.87. There is a 70% stenosis in the mid to distal right PDA which is an overall small vessel.  2. Normal LV systolic function and mildly elevated left ventricular end-diastolic pressure.   Recommendations:  Recommend aggressive treatment of risk factors and smoking cessation. I added atorvastatin.   Coronary Diagram        _____________   History of Present Illness     Edward Powers is a 46 y.o. male with past medical history of HTN and tobacco use who presented to [facility] on [facility]/22/2017 for evaluation of chest pain. 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   The patient developed sudden onset left-sided chest pain which radiated down his left arm with associated dyspnea and diaphoresis. Upon arrival to  [facility], he was given SL NTG and IV Morphine with improvement of his symptoms. Reports a mild pain at this time which is reproducible on palpation and says this clearly feels different from what he experienced this morning.   Hospital Course     Consultants: None  Chest pain: Ruled out for MI. Left heart catheterization on 06/23/17 showed mild to moderate nonobstructive coronary artery disease,  50% very eccentric mid RCA stenosis which was not significant by pressure wire interrogation with an FFR of 0.87. There is a 70% stenosis in the mid to distal right PDA which is an overall small vessel. Normal LV systolic function and mildly elevated left ventricular end-diastolic pressure. Advise risk factor modification including lipid management, smoking cessation, weight loss, improved diet, and increased physical activity. Will discharge home today with outpatient follow up.   Dyslipidemia: Low HDL of 31, LDL 68. With diffuse non-obstructive CAD and strong family history of premature heart disease Atorvastatin 40 mg added. Will need follow up FLP and LFTs in 4-8 weeks.   Hypertension: Not previously treated blood pressure elevated on admission, improved with metoprolol 12.5 mg twice a day.   Tobacco use: Strongly advised cessation especially with strong family history and current nonobstructive CAD.  Polycythemia: Hemoglobin is 18.5. Advise workup including PFTs and a sleep study. Our office will arrange.   _____________  Discharge Vitals Blood pressure (!) 144/76, pulse 61, temperature 98 F (36.7 C), temperature source Oral, resp. rate 20, height 5\' 6"  (1.676 m), weight 211 lb 3.2 oz (95.8 kg), SpO2 96 %.  Filed Weights   06/22/17 1826 06/23/17 0437 06/24/17 0604  Weight: 206 lb 4.8 oz (93.6 kg) 207 lb 9.6 oz (94.2 kg) 211 lb 3.2 oz (95.8 kg)    Labs & Radiologic Studies    CBC  Recent Labs  06/22/17 1040  WBC 7.4  HGB 18.5*  HCT 50.7  MCV 96.4  PLT 171   Basic Metabolic  Panel  Recent Labs  16/10/96 1040 06/23/17 0333  NA 134* 135  K 3.4* 3.8  CL 100* 102  CO2 22 26  GLUCOSE 107* 130*  BUN 6 10  CREATININE 0.80 0.98  CALCIUM 8.8* 8.9   Liver Function Tests  Recent Labs  06/22/17 1040  AST 35  ALT 49  ALKPHOS 70  BILITOT 1.2  PROT 8.6*  ALBUMIN 4.3    Recent Labs  06/22/17 1040  LIPASE 35   Cardiac Enzymes  Recent Labs  06/22/17 1040 06/22/17 1344 06/23/17 0333  TROPONINI <0.03 <0.03 <0.03   BNP Invalid input(s): POCBNP D-Dimer No results for input(s): DDIMER in the last 72 hours. Hemoglobin A1C  Recent Labs  06/23/17 0333  HGBA1C 6.2*   Fasting Lipid Panel  Recent Labs  06/23/17 0333  CHOL 130  HDL 31*  LDLCALC 68  TRIG 045*  CHOLHDL 4.2   Thyroid Function Tests No results for input(s): TSH, T4TOTAL, T3FREE, THYROIDAB in the last 72 hours.  Invalid input(s): FREET3 _____________  Dg Chest 2 View  Result Date: 06/22/2017 CLINICAL DATA:  Chest pain.  Shortness of breath . EXAM: CHEST  2 VIEW COMPARISON:  No recent prior . FINDINGS: Mild prominence of the anterior mediastinum noted on lateral view, this may be related to patient rotation on lateral view. Low lung volumes with mild bibasilar subsegmental atelectasis. Mild bibasilar infiltrates cannot be excluded. No pleural effusion or pneumothorax. Heart size normal. Degenerative changes thoracic spine . IMPRESSION: 1. Low lung volumes with mild bibasilar subsegmental atelectasis. Mild bibasilar infiltrates cannot be excluded . 2. Mild prominence of the anterior mediastinum noted on lateral view. This may be related to patient rotation on lateral view. Repeat PA and lateral chest x-ray suggested. Electronically Signed   By: Maisie Fus  Register   On: 06/22/2017 11:23   Disposition   Pt is being discharged home today in good condition.  Follow-up Plans & Appointments    Follow-up Information    Croitoru, Mihai, MD Follow up.   Specialty:  Cardiology Why:  You  have an appointment to see Theodore Demark, Dr. Erin Hearing PA on 08/04/17 at 11:30. Please arrive 30 minutes early.  Contact information: [address] [place] [address] [phone]        [facility] Follow up.   Specialty:  Cardiology Why:  You will be called to schedule a pulmonary function test and a sleep study. Contact information: [address] [place] [address] [phone]         Discharge Instructions    Diet - low sodium heart healthy    Complete by:  As directed    Discharge instructions    Complete by:  As directed    PLEASE REMEMBER TO BRING ALL OF YOUR MEDICATIONS TO EACH OF YOUR FOLLOW-UP OFFICE VISITS.  PLEASE ATTEND ALL SCHEDULED FOLLOW-UP APPOINTMENTS.   Activity: Increase activity slowly as tolerated. You may shower, but no soaking baths (or swimming) for 1 week. No driving for 24 hours. No lifting over 5 lbs for 1 week. No sexual activity for 1  week.   You May Return to Work: on Monday (if applicable)  Wound Care: You may wash cath site gently with soap and water. Keep cath site clean and dry. If you notice pain, swelling, bleeding or pus at your cath site, please call [phone].   Increase activity slowly    Complete by:  As directed       Discharge Medications   Current Discharge Medication List    START taking these medications   Details  aspirin EC 81 MG EC tablet Take 1 tablet (81 mg total) by mouth daily. Qty: 30 tablet, Refills: 11    atorvastatin (LIPITOR) 40 MG tablet Take 1 tablet (40 mg total) by mouth daily at 6 PM. Qty: 30 tablet, Refills: 11    metoprolol tartrate (LOPRESSOR) 25 MG tablet Take 0.5 tablets (12.5 mg total) by mouth 2 (two) times daily. Qty: 30 tablet, Refills: 11    nitroGLYCERIN (NITROSTAT) 0.4 MG SL tablet Place 1 tablet (0.4 mg total) under the tongue every 5 (five) minutes x 3 doses as needed for chest pain. Qty: 25 tablet, Refills: 6          Outstanding Labs/Studies   Pt will need follow up FLP and LFT's in 4-8 weeks. He is also to be schedules for PFTs and sleep study. Message has been sent to our office to arrange these.   Duration of Discharge Encounter   Greater than 30 minutes including physician time.  Signed, Berton BonJanine Daisee Centner NP 06/24/2017, 11:21 AM

## 2017-06-24 NOTE — Progress Notes (Signed)
Progress Note  Patient Name: Edward Powers Date of Encounter: 06/24/2017  Primary Cardiologist: New- Dr. Royann Shivers  Subjective   Patient if without chest pain or dyspnea. Right wrist is stable.   Inpatient Medications    Scheduled Meds: . aspirin EC  81 mg Oral Daily  . atorvastatin  40 mg Oral q1800  . enoxaparin (LOVENOX) injection  40 mg Subcutaneous Q24H  . feeding supplement  1 Container Oral TID BM  . metoprolol tartrate  12.5 mg Oral BID  . sodium chloride flush  3 mL Intravenous Q12H   Continuous Infusions: . sodium chloride     PRN Meds: sodium chloride, acetaminophen, nitroGLYCERIN, ondansetron (ZOFRAN) IV, sodium chloride flush   Vital Signs    Vitals:   06/23/17 2148 06/23/17 2220 06/24/17 0031 06/24/17 0604  BP: (!) 134/53 107/81 (!) 132/55 (!) 144/76  Pulse: 64 68 66 61  Resp:   18 20  Temp:   98 F (36.7 C) 98 F (36.7 C)  TempSrc:   Oral Oral  SpO2: 96%  96% 96%  Weight:    211 lb 3.2 oz (95.8 kg)  Height:        Intake/Output Summary (Last 24 hours) at 06/24/17 0947 Last data filed at 06/24/17 0912  Gross per 24 hour  Intake             1320 ml  Output              500 ml  Net              820 ml   Filed Weights   06/22/17 1826 06/23/17 0437 06/24/17 0604  Weight: 206 lb 4.8 oz (93.6 kg) 207 lb 9.6 oz (94.2 kg) 211 lb 3.2 oz (95.8 kg)    Telemetry    Sinus rhythm in the 60's - Personally Reviewed  ECG    Sinus bradycardia at 56 bpm Personally Reviewed  Physical Exam   GEN: Obese male. No acute distress.   Neck: No JVD Cardiac: RRR, no murmurs, rubs, or gallops.  Respiratory: Clear to auscultation bilaterally. GI: Soft, nontender, non-distended  MS: No edema; No deformity. Neuro:  Nonfocal  Psych: Normal affect  Right wrist without hematoma, ecchymosis or tenderness.   Labs    Chemistry Recent Labs Lab 06/22/17 1040 06/23/17 0333  NA 134* 135  K 3.4* 3.8  CL 100* 102  CO2 22 26  GLUCOSE 107* 130*  BUN 6 10    CREATININE 0.80 0.98  CALCIUM 8.8* 8.9  PROT 8.6*  --   ALBUMIN 4.3  --   AST 35  --   ALT 49  --   ALKPHOS 70  --   BILITOT 1.2  --   GFRNONAA >60 >60  GFRAA >60 >60  ANIONGAP 12 7     Hematology Recent Labs Lab 06/22/17 1040  WBC 7.4  RBC 5.26  HGB 18.5*  HCT 50.7  MCV 96.4  MCH 35.2*  MCHC 36.5*  RDW 12.1  PLT 171    Cardiac Enzymes Recent Labs Lab 06/22/17 1040 06/22/17 1344 06/23/17 0333  TROPONINI <0.03 <0.03 <0.03   No results for input(s): TROPIPOC in the last 168 hours.   BNPNo results for input(s): BNP, PROBNP in the last 168 hours.   DDimer No results for input(s): DDIMER in the last 168 hours.   Radiology    Dg Chest 2 View  Result Date: 06/22/2017 CLINICAL DATA:  Chest pain.  Shortness of breath . EXAM:  CHEST  2 VIEW COMPARISON:  No recent prior . FINDINGS: Mild prominence of the anterior mediastinum noted on lateral view, this may be related to patient rotation on lateral view. Low lung volumes with mild bibasilar subsegmental atelectasis. Mild bibasilar infiltrates cannot be excluded. No pleural effusion or pneumothorax. Heart size normal. Degenerative changes thoracic spine . IMPRESSION: 1. Low lung volumes with mild bibasilar subsegmental atelectasis. Mild bibasilar infiltrates cannot be excluded . 2. Mild prominence of the anterior mediastinum noted on lateral view. This may be related to patient rotation on lateral view. Repeat PA and lateral chest x-ray suggested. Electronically Signed   By: Maisie Fus  Register   On: 06/22/2017 11:23    Cardiac Studies   Left Heart Cath and Coronary Angiography 06/23/17  Conclusion    The left ventricular systolic function is normal.  LV end diastolic pressure is mildly elevated.  The left ventricular ejection fraction is 55-65% by visual estimate.  Prox RCA to Mid RCA lesion, 20 %stenosed.  Mid RCA lesion, 50 %stenosed.  RPDA lesion, 70 %stenosed.  Prox LAD to Mid LAD lesion, 30 %stenosed.   1.  Mild to moderate nonobstructive coronary artery disease. The coronary arteries are overall moderately to severely calcified especially the right coronary artery. There is 50% very eccentric mid RCA stenosis which was not significant by pressure wire interrogation with an FFR of 0.87. There is a 70% stenosis in the mid to distal right PDA which is an overall small vessel.  2. Normal LV systolic function and mildly elevated left ventricular end-diastolic pressure.   Recommendations:  Recommend aggressive treatment of risk factors and smoking cessation. I added atorvastatin.   Coronary Diagram           Patient Profile     46 y.o. male smoker with HTN, with severe chest discomfort at rest, compatible with unstable angina and ECG ST-T changes, but normal biomarkers. Strong family history of early CAD and premature death from heart disease.  Assessment & Plan    Chest pain: Ruled out for MI. Left heart catheterization yesterday showed  Mild to moderate nonobstructive coronary artery disease,  50% very eccentric mid RCA stenosis which was not significant by pressure wire interrogation with an FFR of 0.87. There is a 70% stenosis in the mid to distal right PDA which is an overall small vessel. Normal LV systolic function and mildly elevated left ventricular end-diastolic pressure. Advise risk factor modification including lipid management, smoking cessation, weight loss, improved diet, and increased physical activity. Will discharge home today with outpatient follow up.   Dyslipidemia: Low HDL of 31, LDL 68. With diffuse non-obstructive CAD and strong family history of premature heart disease Atorvastatin 40 mg added. Will need follow up FLP and LFTs in 4-8 weeks.   Hypertension: Not previously treated blood pressure elevated on admission, improved with metoprolol 12.5 mg twice a day.   Tobacco use: Strongly advised cessation especially with strong family history and current nonobstructive  CAD.  Polycythemia: Hemoglobin is 18.5. Advise workup including PFTs and a sleep study. Our office will arrange.    Signed, Berton Bon, NP  06/24/2017, 9:47 AM    I have seen and examined the patient along with Berton Bon, NP.  I have reviewed the chart, notes and new data.  I agree with NP's note.  Key new complaints: feels well, no problems at radial site Key examination changes: normal CV exam Key new findings / data: reviewed cath results with him in detail.  PLAN: DC  home. Discussed smoking cessation as the single most important measure to prevent disease progression. Statin may be beneficial, although LDL not bad. Low HDL will improve with weight loss and exercise. Low-moderate ETOH consumption is acceptable. ASA 81 mg daily. Outpt pulmonary/sleep evaluation for polycythemia.  Thurmon FairMihai Naoko Diperna, MD, [facility]ACC [facility] [phone] 06/24/2017, 11:07 AM

## 2017-06-24 NOTE — Progress Notes (Signed)
Discharge instructions given, iv discontinued with paper work signs pt will pick up prescriptions at pharmacy.

## 2017-06-24 NOTE — Progress Notes (Signed)
Patient asked to go outside with family member tonight, patient notified that he could not go off the unit without a physicians order. Patients family member was wanting to go outside to smoke with him.

## 2017-07-13 ENCOUNTER — Telehealth: Payer: Self-pay | Admitting: Cardiovascular Disease

## 2017-07-13 DIAGNOSIS — I739 Peripheral vascular disease, unspecified: Secondary | ICD-10-CM

## 2017-07-13 NOTE — Telephone Encounter (Signed)
Tried to call patient and sounded like connected to fax machine Tried to call back busy, will try again later

## 2017-07-13 NOTE — Telephone Encounter (Signed)
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 He is not sure what is happening and wanted to make us aware. States he saw Dr. Salena Saner in the hospital. Reports no swelling or anything abnormal. States that hospital told him he has 70% blockage on the right and 50% on the left.

## 2017-07-13 NOTE — Telephone Encounter (Signed)
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

## 2017-07-15 NOTE — Telephone Encounter (Signed)
Can we get him to have bilateral ABI for PAD before that appointment, please. It may help expedite his evaluation. MCr

## 2017-07-15 NOTE — Addendum Note (Signed)
Addended by: Neoma LamingPUGH, Lyam Provencio J on: 07/15/2017 03:45 PM   Modules accepted: Orders

## 2017-07-15 NOTE — Telephone Encounter (Signed)
Returned call to patient cell # [phone] Dr.Croitoru's recommendations given.Scheduler will call back to schedule.

## 2017-07-19 ENCOUNTER — Other Ambulatory Visit: Payer: Self-pay | Admitting: Cardiovascular Disease

## 2017-07-19 DIAGNOSIS — I739 Peripheral vascular disease, unspecified: Secondary | ICD-10-CM

## 2017-07-20 ENCOUNTER — Ambulatory Visit (HOSPITAL_COMMUNITY)
Admission: RE | Admit: 2017-07-20 | Payer: Self-pay | Source: Ambulatory Visit | Attending: Cardiovascular Disease | Admitting: Cardiovascular Disease

## 2017-07-29 ENCOUNTER — Encounter: Payer: Self-pay | Admitting: Physician Assistant

## 2017-07-29 ENCOUNTER — Ambulatory Visit (INDEPENDENT_AMBULATORY_CARE_PROVIDER_SITE_OTHER): Payer: Self-pay | Admitting: Physician Assistant

## 2017-07-29 VITALS — BP 118/74 | HR 80 | Ht 66.0 in | Wt 207.0 lb

## 2017-07-29 DIAGNOSIS — I2510 Atherosclerotic heart disease of native coronary artery without angina pectoris: Secondary | ICD-10-CM

## 2017-07-29 DIAGNOSIS — Z79899 Other long term (current) drug therapy: Secondary | ICD-10-CM

## 2017-07-29 DIAGNOSIS — R7303 Prediabetes: Secondary | ICD-10-CM

## 2017-07-29 DIAGNOSIS — E785 Hyperlipidemia, unspecified: Secondary | ICD-10-CM

## 2017-07-29 DIAGNOSIS — Z720 Tobacco use: Secondary | ICD-10-CM

## 2017-07-29 NOTE — Patient Instructions (Signed)
Medication Instructions:   No changes.  Labwork:   Your physician recommends that you return for lab work in: 6-8 weeks. This will be a fasting test for a cholesterol check.  Testing/Procedures:  none  Follow-Up:  2-3 months with Dr. Royann Shivers  If you need a refill on your cardiac medications before your next appointment, please call your pharmacy.

## 2017-07-29 NOTE — Progress Notes (Signed)
Cardiology Office Note    Date:  07/30/2017   ID:  Edward Powers, DOB [birth year], MRN [id number]  PCP:  Patient, No Pcp Per  Cardiologist:  Dr. Royann Shivers  Chief Complaint  Patient presents with  . Follow-up    post hopsital, legs and knees turn blue/prurple after sitting for long periods of time    History of Present Illness:  Edward Powers is a 46 y.o. male with PMH of HTN, prediabetes with A1C 6.2 on 06/23/17, tobacco abuse. He recently presented to the hospital on 06/22/2017 with chest pain. Serial troponin was negative. Cardiac catheterization performed on 06/23/2017 showed EF 55-65%, 20% proximal RCA lesion, 50% mid RCA lesion with negative FFR of 0.87, 70% RPDA lesion, 30% proximal LAD lesion. Aggressive medical therapy was recommended. Lipitor was added. He also had polycythemia with hemoglobin of 18.5,  Outpatient PFT and a sleep study was recommended.  He is accompanied by his wife today. They're trying to obtain insurance, and wish to hold off outpatient PFT and a sleep study until later. Most likely cause for his elevated hemoglobin is chronic tobacco abuse. According to his wife, he has been trying to cut back on smoking. He will need a fasting lipid panel and LFTs in 6-8 weeks after recent addition of 40 mg daily of Lipitor. His LDL goal is less than 70. Otherwise, he denies any further chest discomfort. We discussed risk factor prevention. He is aware that his hemoglobin A1c is 6.2 which placed him in the prediabetes range. He is trying to increase her activity level and eating healthier.   Past Medical History:  Diagnosis Date  . Borderline diabetes   . CAD (coronary artery disease)    LHC 06/23/17:  50% very eccentric mid RCA stenosis with FFR of 0.87, 70% stenosis in mid to distal right PDA.  Marland Kitchen Hypertension   . Obesity   . Ulcer (traumatic) of oral mucosa     Past Surgical History:  Procedure Laterality Date  . LEFT HEART CATH AND CORONARY ANGIOGRAPHY N/A 06/23/2017   Procedure: Left Heart Cath and Coronary Angiography;  Surgeon: Iran Ouch, MD;  Location: [facility] INVASIVE CV LAB;  Service: Cardiovascular;  Laterality: N/A;  . LEG SURGERY      Current Medications: Outpatient Medications Prior to Visit  Medication Sig Dispense Refill  . aspirin EC 81 MG EC tablet Take 1 tablet (81 mg total) by mouth daily. 30 tablet 11  . atorvastatin (LIPITOR) 40 MG tablet Take 1 tablet (40 mg total) by mouth daily at 6 PM. 30 tablet 11  . metoprolol tartrate (LOPRESSOR) 25 MG tablet Take 0.5 tablets (12.5 mg total) by mouth 2 (two) times daily. 30 tablet 11  . nitroGLYCERIN (NITROSTAT) 0.4 MG SL tablet Place 1 tablet (0.4 mg total) under the tongue every 5 (five) minutes x 3 doses as needed for chest pain. 25 tablet 6   No facility-administered medications prior to visit.      Allergies:   Patient has no known allergies.   Social History   Social History  . Marital status: Married    Spouse name: N/A  . Number of children: N/A  . Years of education: N/A   Social History Main Topics  . Smoking status: Current Every Day Smoker    Packs/day: 1.00    Years: 30.00  . Smokeless tobacco: Never Used  . Alcohol use 12.0 oz/week    20 Cans of beer per week  . Drug use: No  . Sexual  activity: Not Asked   Other Topics Concern  . None   Social History Narrative  . None     Family History:  The patient's family history includes CAD in his paternal uncle; Diabetes Mellitus II in his father.   ROS:   Please see the history of present illness.    ROS All other systems reviewed and are negative.   PHYSICAL EXAM:   VS:  BP 118/74   Pulse 80   Ht 5\' 6"  (1.676 m)   Wt 207 lb (93.9 kg)   BMI 33.41 kg/m    GEN: Well nourished, well developed, in no acute distress  HEENT: normal  Neck: no JVD, carotid bruits, or masses Cardiac: RRR; no murmurs, rubs, or gallops,no edema  Respiratory:  clear to auscultation bilaterally, normal work of breathing GI: soft,  nontender, nondistended, + BS MS: no deformity or atrophy  Skin: warm and dry, no rash Neuro:  Alert and Oriented x 3, Strength and sensation are intact Psych: euthymic mood, full affect  Wt Readings from Last 3 Encounters:  07/29/17 207 lb (93.9 kg)  06/24/17 211 lb 3.2 oz (95.8 kg)      Studies/Labs Reviewed:   EKG:  EKG is not ordered today. =  Recent Labs: 06/22/2017: ALT 49; Hemoglobin 18.5; Platelets 171 06/23/2017: BUN 10; Creatinine, Ser 0.98; Potassium 3.8; Sodium 135   Lipid Panel    Component Value Date/Time   CHOL 130 06/23/2017 0333   TRIG 157 (H) 06/23/2017 0333   HDL 31 (L) 06/23/2017 0333   CHOLHDL 4.2 06/23/2017 0333   VLDL 31 06/23/2017 0333   LDLCALC 68 06/23/2017 0333    Additional studies/ records that were reviewed today include:   Cath 06/23/2017 Conclusion     The left ventricular systolic function is normal.  LV end diastolic pressure is mildly elevated.  The left ventricular ejection fraction is 55-65% by visual estimate.  Prox RCA to Mid RCA lesion, 20 %stenosed.  Mid RCA lesion, 50 %stenosed.  RPDA lesion, 70 %stenosed.  Prox LAD to Mid LAD lesion, 30 %stenosed.   1. Mild to moderate nonobstructive coronary artery disease. The coronary arteries are overall moderately to severely calcified especially the right coronary artery. There is 50% very eccentric mid RCA stenosis which was not significant by pressure wire interrogation with an FFR of 0.87. There is a 70% stenosis in the mid to distal right PDA which is an overall small vessel.  2. Normal LV systolic function and mildly elevated left ventricular end-diastolic pressure.   Recommendations:  Recommend aggressive treatment of risk factors and smoking cessation. I added atorvastatin.      ASSESSMENT:    1. Coronary artery disease involving native coronary artery of native heart without angina pectoris   2. Encounter for long-term (current) use of medications   3.  Dyslipidemia, goal LDL below 70   4. Prediabetes   5. Tobacco abuse      PLAN:  In order of problems listed above:  1. CAD: Recent cardiac catheterization showed nonobstructive disease, 50% mid RCA lesion was negative FFR, 70% stenosis in the mid to distal right PDA which is a small vessel. Aggressive medical therapy was recommended. He is currently on aspirin, 40 mg daily of Lipitor, and metoprolol.  2. Hyperlipidemia: On Lipitor 40 mg daily. Fasting lipid panel in 6-8 weeks.   3. Prediabetes hemoglobin A1c 6.2, recommended diet and exercise.  4. Tobacco abuse: He is cutting back on cigarettes    Medication Adjustments/Labs  and Tests Ordered: Current medicines are reviewed at length with the patient today.  Concerns regarding medicines are outlined above.  Medication changes, Labs and Tests ordered today are listed in the Patient Instructions below. Patient Instructions  Medication Instructions:   No changes.  Labwork:   Your physician recommends that you return for lab work in: 6-8 weeks. This will be a fasting test for a cholesterol check.  Testing/Procedures:  none  Follow-Up:  2-3 months with Dr. Royann Shivers  If you need a refill on your cardiac medications before your next appointment, please call your pharmacy.      Ramond Dial, [place]  07/30/2017 10:33 PM    [facility] [address] [place], [place], [place]  [address] Phone: [phone]; Fax: [phone]

## 2017-07-30 ENCOUNTER — Encounter: Payer: Self-pay | Admitting: Physician Assistant

## 2017-08-04 ENCOUNTER — Ambulatory Visit: Payer: Self-pay | Admitting: Physician Assistant

## 2017-08-26 ENCOUNTER — Telehealth: Payer: Self-pay

## 2017-08-26 DIAGNOSIS — I25119 Atherosclerotic heart disease of native coronary artery with unspecified angina pectoris: Secondary | ICD-10-CM

## 2017-08-26 NOTE — Telephone Encounter (Signed)
Mailbox full. Unable to leave message.  PFTs ordered. Message sent to admin for scheduling.

## 2017-08-26 NOTE — Telephone Encounter (Signed)
-----   Message from Berton Bon, NP sent at 06/24/2017 10:48 AM EDT ----- Regarding: order PFT Can you please arrange for outpatient PFT's for this pt per Dr. Royann Shivers.  Thanks, Lizabeth Leyden, NP

## 2017-09-03 ENCOUNTER — Telehealth: Payer: Self-pay | Admitting: Physician Assistant

## 2017-09-03 NOTE — Telephone Encounter (Signed)
OK, will review at appt. Thanks [employer]

## 2017-09-03 NOTE — Telephone Encounter (Signed)
Spoke with patient and he was wanting to know why he needed PFT's. Explained was unsure but he wanted to cancel secondary to no insurance and just discuss with Dr Royann Shivers at follow up in November Will forward to Dr Royann Shivers for review

## 2017-09-03 NOTE — Telephone Encounter (Signed)
New Message  Pt call requesting to speak with RN, pt states he was complete to do a breathing test and he would like to know why. Please call back to discuss

## 2017-09-07 ENCOUNTER — Encounter (HOSPITAL_COMMUNITY): Payer: Self-pay

## 2017-09-10 ENCOUNTER — Telehealth: Payer: Self-pay | Admitting: Cardiovascular Disease

## 2017-09-10 MED ORDER — ALBUTEROL SULFATE HFA 108 (90 BASE) MCG/ACT IN AERS: 1.0000 | INHALATION_SPRAY | Freq: Four times a day (QID) | RESPIRATORY_TRACT | 0 refills | Status: AC | PRN

## 2017-09-10 NOTE — Telephone Encounter (Signed)
Ok to call in a Rx for an albuterol MDI, no refills, 1-2 puffs every 6 hours as needed, but reinforce your advice that he should seek evaluation at Urgent Care, especially if he has a productive cough, fever, chest pain or if he does not get better ina few days. MCr

## 2017-09-10 NOTE — Telephone Encounter (Signed)
Patient has been called back and informed that Albuterol has been called in for him. He has been advised that if he does not get better and develops a productive cough, fever or chest pain then he should seek medical attention from Urgent Care. He verbalized his understanding.

## 2017-09-10 NOTE — Telephone Encounter (Signed)
Pt says he has Bronchitis,does not have a primary care doctor and can not afford to go to the doctor. He would like Dr C to call him in an inhaler and something for his cough please.

## 2017-09-10 NOTE — Telephone Encounter (Signed)
Spoke with the patient. He stated that he thinks he has bronchitis. He is having the same symptoms that he had previously. He would like for Dr. Royann Shivers to call in an inhaler and something for his cough.   The patient does not have a PCP at this time. The patient was advised that he needs to go to an Urgent Care to be assessed and diagnosed correctly. He stated that he has been out of work for three weeks therefore he cannot afford it at this time.   He was once again advised that he needs to be seen by a physician in order to be assessed in case he has developed something other than bronchitis that may require different treatment. Will route to the provider for his knowledge

## 2017-10-26 ENCOUNTER — Ambulatory Visit: Payer: Self-pay | Admitting: Cardiovascular Disease

## 2017-10-26 ENCOUNTER — Encounter: Payer: Self-pay | Admitting: *Deleted

## 2018-01-17 ENCOUNTER — Telehealth: Payer: Self-pay | Admitting: Cardiovascular Disease

## 2018-01-17 NOTE — Telephone Encounter (Signed)
I am very sorry for his loss, but I'm afraid I cannot prescribe him any anxiety or pain medications. MCr

## 2018-01-17 NOTE — Telephone Encounter (Signed)
New message   Patient just loss his wife today have no PCP, asking if Dr. Royann Shiversroitoru could called in some medication for him.

## 2018-01-17 NOTE — Telephone Encounter (Signed)
Left message to call back  

## 2018-01-21 NOTE — Telephone Encounter (Signed)
Advised patient, verbalized understanding  

## 2019-01-26 ENCOUNTER — Emergency Department (HOSPITAL_BASED_OUTPATIENT_CLINIC_OR_DEPARTMENT_OTHER): Payer: Self-pay

## 2019-01-26 ENCOUNTER — Encounter (HOSPITAL_BASED_OUTPATIENT_CLINIC_OR_DEPARTMENT_OTHER): Payer: Self-pay | Admitting: Emergency Medicine

## 2019-01-26 ENCOUNTER — Other Ambulatory Visit: Payer: Self-pay

## 2019-01-26 ENCOUNTER — Emergency Department (HOSPITAL_BASED_OUTPATIENT_CLINIC_OR_DEPARTMENT_OTHER)
Admission: EM | Admit: 2019-01-26 | Discharge: 2019-01-26 | Disposition: A | Discharge status: Home / Self Care | Payer: Self-pay | Attending: Emergency Medicine | Admitting: Emergency Medicine

## 2019-01-26 DIAGNOSIS — Y9389 Activity, other specified: Secondary | ICD-10-CM | POA: Insufficient documentation

## 2019-01-26 DIAGNOSIS — Y998 Other external cause status: Secondary | ICD-10-CM | POA: Insufficient documentation

## 2019-01-26 DIAGNOSIS — I10 Essential (primary) hypertension: Secondary | ICD-10-CM | POA: Insufficient documentation

## 2019-01-26 DIAGNOSIS — Z7982 Long term (current) use of aspirin: Secondary | ICD-10-CM | POA: Insufficient documentation

## 2019-01-26 DIAGNOSIS — F17210 Nicotine dependence, cigarettes, uncomplicated: Secondary | ICD-10-CM | POA: Insufficient documentation

## 2019-01-26 DIAGNOSIS — Y929 Unspecified place or not applicable: Secondary | ICD-10-CM | POA: Insufficient documentation

## 2019-01-26 DIAGNOSIS — W19XXXA Unspecified fall, initial encounter: Secondary | ICD-10-CM | POA: Insufficient documentation

## 2019-01-26 DIAGNOSIS — I2510 Atherosclerotic heart disease of native coronary artery without angina pectoris: Secondary | ICD-10-CM | POA: Insufficient documentation

## 2019-01-26 DIAGNOSIS — S59901A Unspecified injury of right elbow, initial encounter: Secondary | ICD-10-CM | POA: Insufficient documentation

## 2019-01-26 DIAGNOSIS — Z79899 Other long term (current) drug therapy: Secondary | ICD-10-CM | POA: Insufficient documentation

## 2019-01-26 MED ORDER — OXYCODONE-ACETAMINOPHEN 5-325 MG PO TABS: 1.0000 | ORAL_TABLET | Freq: Once | ORAL | Status: DC

## 2019-01-26 MED ORDER — ACETAMINOPHEN 325 MG PO TABS
650.0000 mg | ORAL_TABLET | Freq: Once | ORAL | Status: AC
  Administered 2019-01-26: 650 mg via ORAL
  Filled 2019-01-26: qty 2

## 2019-01-26 MED ORDER — KETOROLAC TROMETHAMINE 30 MG/ML IJ SOLN
30.0000 mg | Freq: Once | INTRAMUSCULAR | Status: AC
  Administered 2019-01-26: 30 mg via INTRAMUSCULAR
  Filled 2019-01-26: qty 1

## 2019-01-26 NOTE — ED Notes (Signed)
Pt verbalizes understanding of dc instructions, ambulated to lobby to wait for his family to pick him up

## 2019-01-26 NOTE — ED Notes (Signed)
No vitals done Pt insisted in leaving and smoking a cigarette.

## 2019-01-26 NOTE — ED Notes (Addendum)
Pt again requesting something to drink, and nurse again informed him that we have to wait until his xray is read by the radiologist. Pt becoming argumentative with nurse, stating "what difference does it make? I was told I could have something when the xrays were done." Pt informed that this nurse was the one who told him that he couldn't have anything to drink, and that nurse specifically stated nothing to drink until the xray is read by the radiologist. Nurse reeducated pt to this, despite the motivational barrier to learning exhibited by patient.

## 2019-01-26 NOTE — ED Notes (Signed)
Pt returned from xray, prefers to sit in wheelchair

## 2019-01-26 NOTE — ED Notes (Signed)
Pt appears quite sleepy and is closing eyes during nurse assessment

## 2019-01-26 NOTE — ED Provider Notes (Signed)
[facility] EMERGENCY DEPARTMENT Provider Note   CSN: [id number] Arrival date & time: 01/26/19  1602    History   Chief Complaint Chief Complaint  Patient presents with  . Arm Injury    right    HPI Edward Powers is a 48 y.o. male past medical history of hypertension, CAD, polysubstance abuse, presenting to the emergency department with acute onset of right arm pain that began 30 minutes prior to arrival.  Patient states he had a mechanical fall, falling on his right outstretched arm.  He has significant pain to right wrist forearm and elbow with any palpation or movement.  He states his shoulder does not hurt.  No medications tried prior to arrival.  He is right-hand dominant.  He did not hit his head or lose conciousness.     The history is provided by the patient.    Past Medical History:  Diagnosis Date  . Borderline diabetes   . CAD (coronary artery disease)    LHC 06/23/17:  50% very eccentric mid RCA stenosis with FFR of 0.87, 70% stenosis in mid to distal right PDA.  Marland Kitchen Hypertension   . Obesity   . Ulcer (traumatic) of oral mucosa     Patient Active Problem List   Diagnosis Date Noted  . Essential (primary) hypertension 06/24/2017  . Tobacco abuse 06/24/2017  . Dyslipidemia, goal LDL below 70 06/24/2017  . Polycythemia 06/24/2017  . CAD (coronary artery disease) 06/24/2017  . Chest pain 06/22/2017    Past Surgical History:  Procedure Laterality Date  . LEFT HEART CATH AND CORONARY ANGIOGRAPHY N/A 06/23/2017   Procedure: Left Heart Cath and Coronary Angiography;  Surgeon: Iran Ouch, MD;  Location: [facility] INVASIVE CV LAB;  Service: Cardiovascular;  Laterality: N/A;  . LEG SURGERY          Home Medications    Prior to Admission medications   Medication Sig Start Date End Date Taking? Authorizing Provider  albuterol (PROVENTIL HFA;VENTOLIN HFA) 108 (90 Base) MCG/ACT inhaler Inhale 1-2 puffs into the lungs every 6 (six) hours as needed for  wheezing or shortness of breath. 09/10/17   Croitoru, Mihai, MD  aspirin EC 81 MG EC tablet Take 1 tablet (81 mg total) by mouth daily. 06/25/17   Berton Bon, NP  atorvastatin (LIPITOR) 40 MG tablet Take 1 tablet (40 mg total) by mouth daily at 6 PM. 06/24/17   Berton Bon, NP  metoprolol tartrate (LOPRESSOR) 25 MG tablet Take 0.5 tablets (12.5 mg total) by mouth 2 (two) times daily. 06/24/17   Berton Bon, NP  nitroGLYCERIN (NITROSTAT) 0.4 MG SL tablet Place 1 tablet (0.4 mg total) under the tongue every 5 (five) minutes x 3 doses as needed for chest pain. 06/24/17   Berton Bon, NP    Family History Family History  Problem Relation Age of Onset  . Diabetes Mellitus II Father   . CAD Paternal Uncle        Initial MI in his 80's    Social History Social History   Tobacco Use  . Smoking status: Current Every Day Smoker    Packs/day: 1.00    Years: 30.00    Pack years: 30.00  . Smokeless tobacco: Never Used  Substance Use Topics  . Alcohol use: Yes    Alcohol/week: 20.0 standard drinks    Types: 20 Cans of beer per week  . Drug use: No     Allergies   Patient has no known allergies.   Review  of Systems Review of Systems  Musculoskeletal: Positive for arthralgias and myalgias.  Skin: Negative for color change and wound.  Neurological: Negative for syncope.     Physical Exam Updated Vital Signs BP 107/65   Pulse 90   Temp 97.8 F (36.6 C) (Oral)   Resp 16   Ht 5\' 6"  (1.676 m)   Wt 83.9 kg   SpO2 97%   BMI 29.86 kg/m   Physical Exam Vitals signs and nursing note reviewed.  Constitutional:      General: He is not in acute distress.    Appearance: He is well-developed.     Comments: Patient resting comfortably on his phone upon entering the room.  Once I began asking patient questions, he began wincing with pain.  HENT:     Head: Normocephalic and atraumatic.  Eyes:     Conjunctiva/sclera: Conjunctivae normal.  Cardiovascular:     Rate and  Rhythm: Normal rate.  Pulmonary:     Effort: Pulmonary effort is normal.  Musculoskeletal:     Comments: Patient reporting significant generalized tenderness to wrist, forearm, and elbow.  There is some redness to the posterior aspect of the forearm.  Some slight swelling noted.  No obvious deformity or ecchymosis.  Patient with strong grip strength.  Normal forward flexion of the shoulder, however this causes pain to the lower arm.  Patient unable to fully supinate the right forearm secondary to pain.  Normal distal sensation.  Neurological:     Mental Status: He is alert.  Psychiatric:        Mood and Affect: Mood normal.        Behavior: Behavior normal.      ED Treatments / Results  Labs (all labs ordered are listed, but only abnormal results are displayed) Labs Reviewed - No data to display  EKG None  Radiology Dg Elbow Complete Right  Result Date: 01/26/2019 CLINICAL DATA:  Fall at home today with right elbow and forearm pain EXAM: RIGHT ELBOW - COMPLETE 3+ VIEW COMPARISON:  None. FINDINGS: Suggestion of mild displacement of the anterior fat pad. No definite lucency to suggest fracture. No evidence of dislocation. IMPRESSION: No acute fracture.  Possible mild joint effusion. Electronically Signed   By: Elberta Fortisaniel  Boyle M.D.   On: 01/26/2019 17:31   Dg Forearm Right  Result Date: 01/26/2019 CLINICAL DATA:  Injury to right arm after fall at home today. EXAM: RIGHT FOREARM - 2 VIEW COMPARISON:  None. FINDINGS: Mild soft tissue swelling over the dorsum of the forearm. No fracture or joint dislocation is identified at the wrist or elbow. The radius and ulna appear intact. IMPRESSION: Mild soft tissue swelling of the forearm. No acute osseous abnormality. Electronically Signed   By: Tollie Ethavid  Kwon M.D.   On: 01/26/2019 17:46   Dg Wrist Complete Right  Result Date: 01/26/2019 CLINICAL DATA:  Patient fell at home today and presents with wrist pain. EXAM: RIGHT WRIST - COMPLETE 3+ VIEW  COMPARISON:  None. FINDINGS: There is no evidence of fracture or dislocation. There is no evidence of arthropathy or other focal bone abnormality. Soft tissues are unremarkable. IMPRESSION: Negative. Electronically Signed   By: Tollie Ethavid  Kwon M.D.   On: 01/26/2019 17:28    Procedures Procedures (including critical care time)  Medications Ordered in ED Medications  ketorolac (TORADOL) 30 MG/ML injection 30 mg (30 mg Intramuscular Given 01/26/19 1628)     Initial Impression / Assessment and Plan / ED Course  I have reviewed the triage  vital signs and the nursing notes.  Pertinent labs & imaging results that were available during my care of the patient were reviewed by me and considered in my medical decision making (see chart for details).        Patient with fall on right arm prior to arrival. No obvious deformity on exam, however pt with diffuse tenderness to right forearm with palpation. Shoulder is atraumatic and not painful with movement. X-Ray negative for obvious fracture or dislocation however elbow film with possible mild joint effusion and mild displacement of anterior fat pad. Arm placed in sugar tong splint and sling as a precaution. Pt advised to follow up with orthopedics in 1 week for repeat xray w possibility of missed fracture diagnosis.  Pain treated in the ED with Toradol, however patient requesting narcotics.  Given patient's recent hospitalization last month for alcohol and drug overdose, do not feel comfortable providing controlled substances at this time.  Recommend treatment with over-the-counter medication such as ibuprofen and Tylenol.  Patient stating those medications "will not work" and he "will just come back" to the ED for pain medication.  Patient discharged with orthopedic referral.  Discussed results, findings, treatment and follow up. Patient advised of return precautions. Patient verbalized understanding and agreed with plan.  Final Clinical Impressions(s) / ED  Diagnoses   Final diagnoses:  Elbow injury, right, initial encounter  Fall, initial encounter    ED Discharge Orders    None       Zariya Minner, [place] N, PA-C 01/26/19 1845    Virgina Norfolk, DO 01/26/19 1846

## 2019-01-26 NOTE — ED Notes (Signed)
Patient transported to X-ray 

## 2019-01-26 NOTE — ED Triage Notes (Signed)
Pt reports fall today 35 min ago, holding his arm , unable to move it, appears in a lot distress, no loc. Larey Seat landed on right side .

## 2019-01-26 NOTE — ED Notes (Signed)
Pt again asking for something to drink, and again informed that his xray has not resulted. Pt asking for something for pain, informed that he had something for pain, pt replied "well it's not doing s--t!" Nurse asked pt to refrain from foul language.

## 2019-01-26 NOTE — Discharge Instructions (Addendum)
Please read instructions below. Keep your splint on at all times.  Keep it clean and dry.  Do not bear any weight to your right hand, however keep your fingers moving. You can take ibuprofen every 6 hours as needed for pain.  Can alternate this with Tylenol for added pain relief. Schedule an appointment with the orthopedic specialist in 1 week for repeat x-ray and follow-up on your injury. Return to the ER for new or concerning symptoms.

## 2019-01-26 NOTE — ED Notes (Signed)
Pt c/o right arm pain from falling and trying to catch himself with his hand. There is no swelling or deformity to arm or hand, unable to see if the is asymmetry with shoulder as the pt won't move arm and is sitting on an angle. Distal pulses intact and pt able to move fingers. Pt asking for something to drink, informed him that we need to wait for his xrays to come back.

## 2020-08-01 IMAGING — DX DG ELBOW COMPLETE 3+V*R*
5 series · 5 of 5 positions shown · non-contrast
Comparison: None.

CLINICAL DATA: Fall at home today with right elbow and forearm pain

EXAM:
RIGHT ELBOW - COMPLETE 3+ VIEW

[elbow ap]
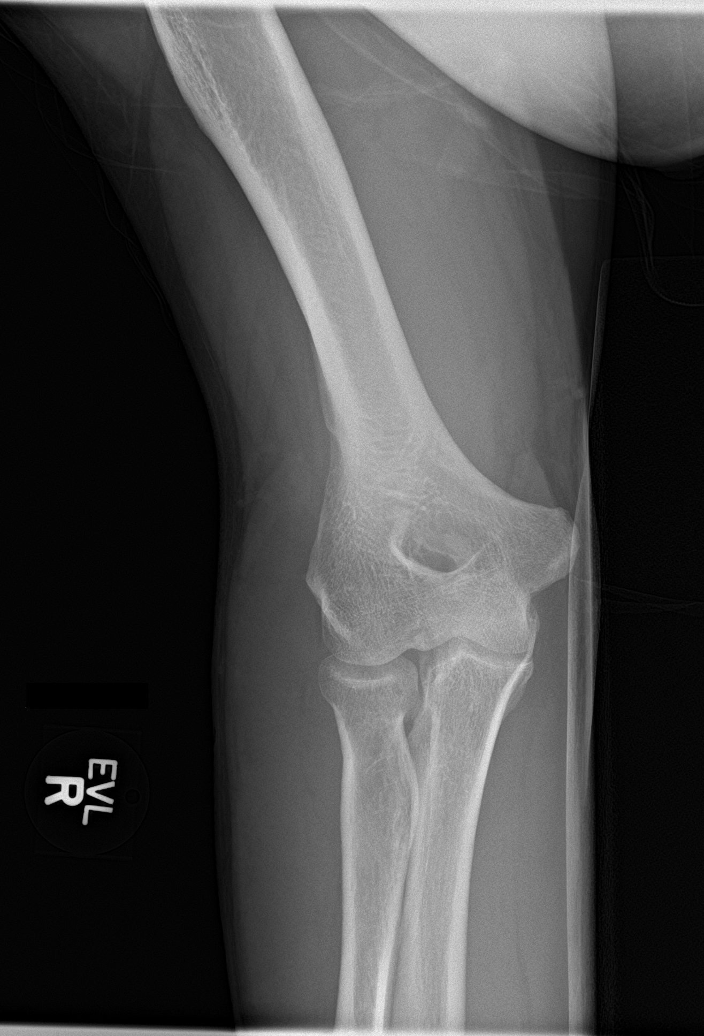

[elbow obl (1 of 3)]
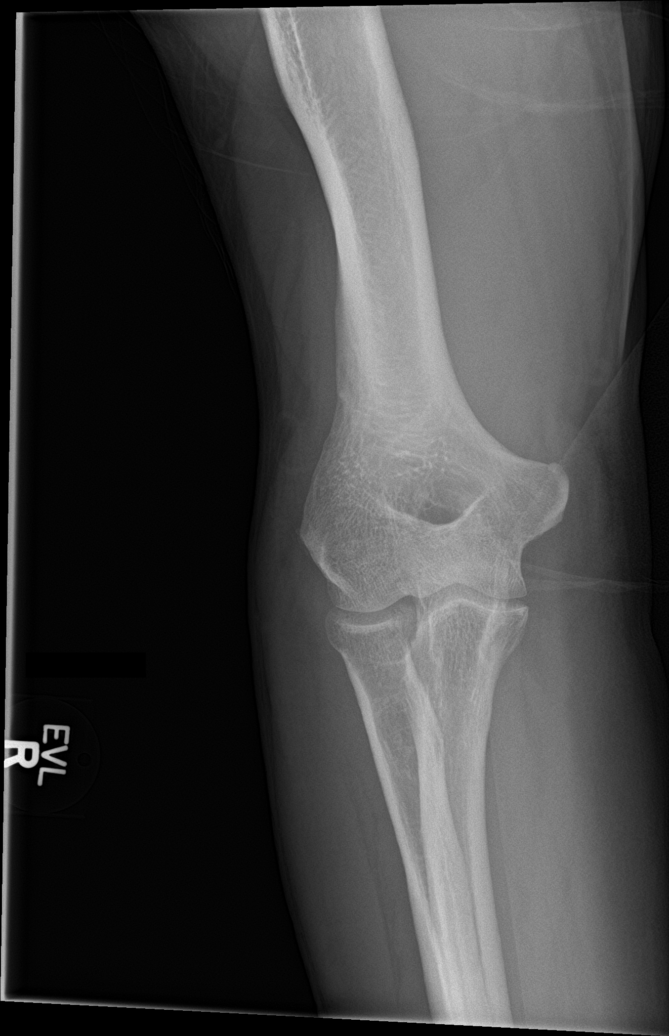

[elbow obl (2 of 3)]
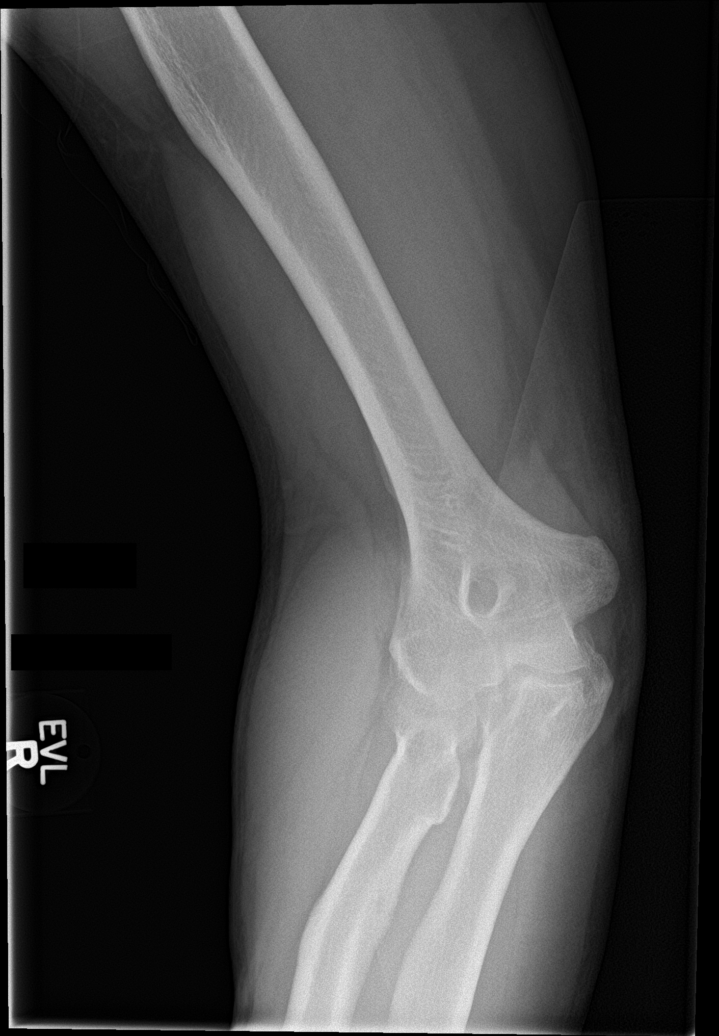

[elbow lat]
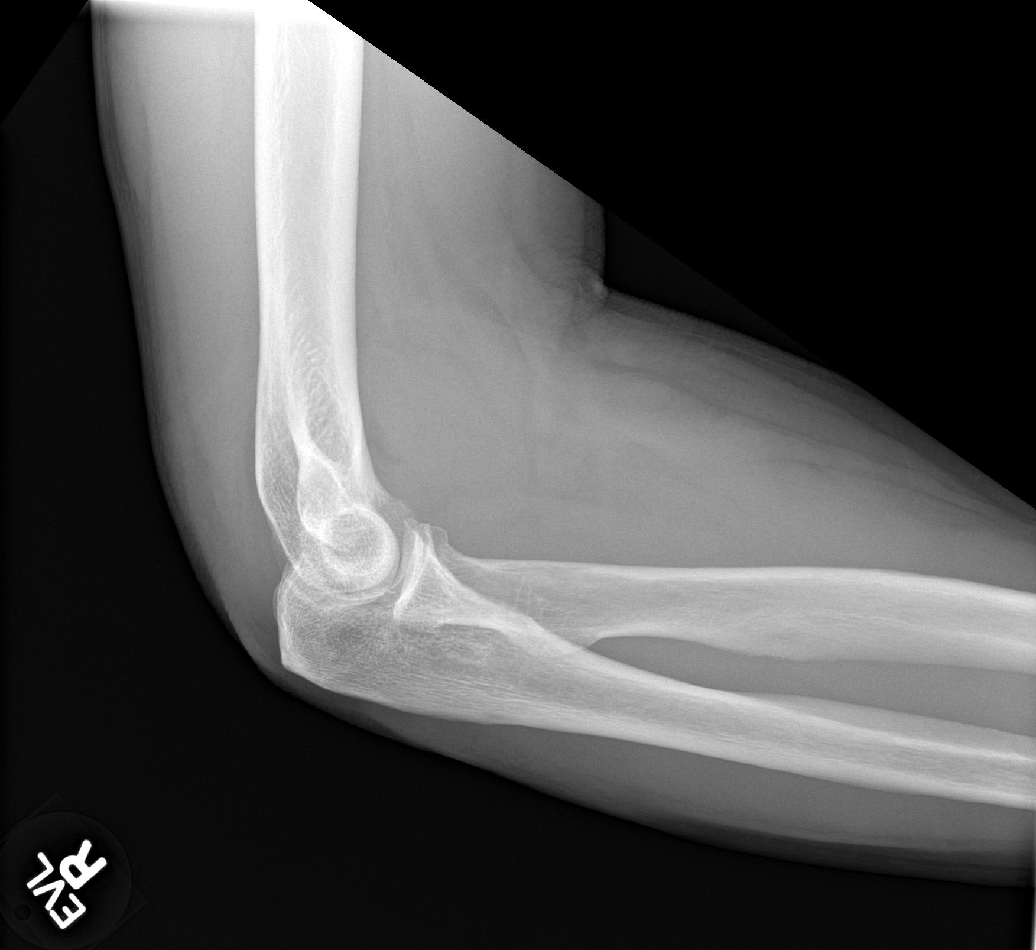

[elbow obl (3 of 3)]
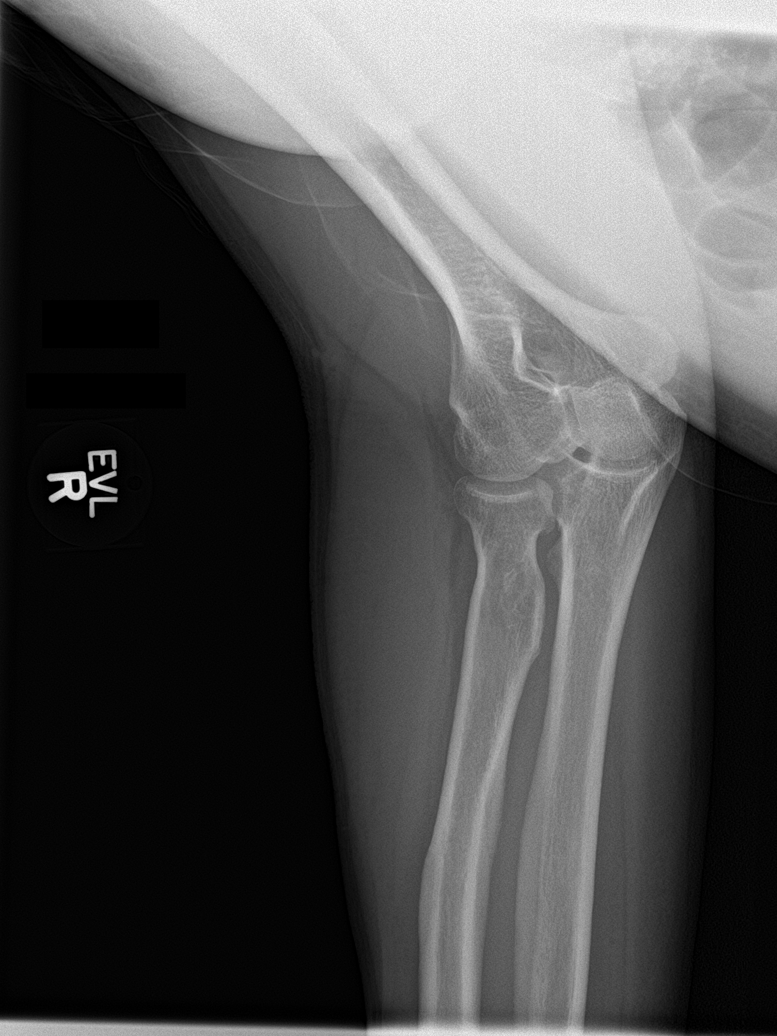

[5 of 5 positions shown; findings below may reference images not displayed]

FINDINGS: Suggestion of mild displacement of the anterior fat pad. No definite
lucency to suggest fracture. No evidence of dislocation.
IMPRESSION: No acute fracture.  Possible mild joint effusion.

## 2022-01-10 ENCOUNTER — Emergency Department (HOSPITAL_COMMUNITY)

## 2022-01-10 ENCOUNTER — Encounter (HOSPITAL_COMMUNITY): Payer: Self-pay

## 2022-01-10 ENCOUNTER — Other Ambulatory Visit: Payer: Self-pay

## 2022-01-10 ENCOUNTER — Emergency Department (HOSPITAL_COMMUNITY)
Admission: EM | Admit: 2022-01-10 | Discharge: 2022-01-10 | Attending: Emergency Medicine | Admitting: Emergency Medicine

## 2022-01-10 DIAGNOSIS — R0781 Pleurodynia: Secondary | ICD-10-CM | POA: Diagnosis present

## 2022-01-10 DIAGNOSIS — R0602 Shortness of breath: Secondary | ICD-10-CM | POA: Insufficient documentation

## 2022-01-10 DIAGNOSIS — W108XXA Fall (on) (from) other stairs and steps, initial encounter: Secondary | ICD-10-CM | POA: Diagnosis not present

## 2022-01-10 DIAGNOSIS — Z7982 Long term (current) use of aspirin: Secondary | ICD-10-CM | POA: Diagnosis not present

## 2022-01-10 DIAGNOSIS — W19XXXA Unspecified fall, initial encounter: Secondary | ICD-10-CM

## 2022-01-10 DIAGNOSIS — Z79899 Other long term (current) drug therapy: Secondary | ICD-10-CM | POA: Insufficient documentation

## 2022-01-10 NOTE — Discharge Instructions (Signed)
You were seen in the emergency department after a fall.  Your x-rays showed no acute fractures.  I think your symptoms are likely related to muscular tenderness and bruising.  You can take ibuprofen or Tylenol as needed for pain.

## 2022-01-10 NOTE — ED Triage Notes (Addendum)
Patient BIB [facility] after falling down the steps. He thinks he broke his right ribs. He had "a little too much to drink." Patient is under arrest.

## 2022-01-10 NOTE — ED Provider Notes (Signed)
[facility] Provider Note   CSN: [id number] Arrival date & time: 01/10/22  2025     History  Chief Complaint  Patient presents with   Edward Powers is a 51 y.o. male who presents the emergency department accompanied by police after a fall.  It is reported that patient had "too much to drink", and fell down the stairs landing on his right rib.  He has severe sharp pain in his right chest wall that is worse with inspiration.  He denied any other injury, or loss of consciousness.   Fall Associated symptoms include shortness of breath.      Home Medications Prior to Admission medications   Medication Sig Start Date End Date Taking? Authorizing Provider  albuterol (PROVENTIL HFA;VENTOLIN HFA) 108 (90 Base) MCG/ACT inhaler Inhale 1-2 puffs into the lungs every 6 (six) hours as needed for wheezing or shortness of breath. 09/10/17   Croitoru, Mihai, MD  aspirin EC 81 MG EC tablet Take 1 tablet (81 mg total) by mouth daily. 06/25/17   Berton Bon, NP  atorvastatin (LIPITOR) 40 MG tablet Take 1 tablet (40 mg total) by mouth daily at 6 PM. 06/24/17   Berton Bon, NP  metoprolol tartrate (LOPRESSOR) 25 MG tablet Take 0.5 tablets (12.5 mg total) by mouth 2 (two) times daily. 06/24/17   Berton Bon, NP  nitroGLYCERIN (NITROSTAT) 0.4 MG SL tablet Place 1 tablet (0.4 mg total) under the tongue every 5 (five) minutes x 3 doses as needed for chest pain. 06/24/17   Berton Bon, NP      Allergies    Patient has no known allergies.    Review of Systems   Review of Systems  Respiratory:  Positive for shortness of breath.   Musculoskeletal:        Right-sided chest wall pain  All other systems reviewed and are negative.  Physical Exam Updated Vital Signs BP (!) 183/108 (BP Location: Left Arm)    Pulse (!) 101    Temp 98 F (36.7 C) (Oral)    Resp 19    Ht 5\' 6"  (1.676 m)    Wt 84 kg    SpO2 98%    BMI 29.89 kg/m  Physical Exam Vitals and  nursing note reviewed.  Constitutional:      Appearance: Normal appearance.  HENT:     Head: Normocephalic and atraumatic.  Eyes:     Conjunctiva/sclera: Conjunctivae normal.  Cardiovascular:     Rate and Rhythm: Normal rate and regular rhythm.  Pulmonary:     Effort: Pulmonary effort is normal. No respiratory distress.     Breath sounds: Normal breath sounds.  Chest:     Comments: Tenderness to palpation of the right anterior lateral chest.  No obvious bruising or deformities. Superficial abrasions on the right posterior trunk, and bilateral forearms with controlled bleeding. Abdominal:     General: There is no distension.     Palpations: Abdomen is soft.     Tenderness: There is no abdominal tenderness.  Skin:    General: Skin is warm and dry.  Neurological:     General: No focal deficit present.     Mental Status: He is alert.    ED Results / Procedures / Treatments   Labs (all labs ordered are listed, but only abnormal results are displayed) Labs Reviewed - No data to display  EKG None  Radiology DG Chest 2 View  Result Date: 01/10/2022 CLINICAL DATA:  Rib  pain, fall EXAM: CHEST - 2 VIEW COMPARISON:  09/02/2018.  Rib series today. FINDINGS: Heart is normal size. Lungs clear. No effusions or acute bony abnormality. No pneumothorax. No visible rib fracture. IMPRESSION: No active cardiopulmonary disease. Electronically Signed   By: Charlett Nose M.D.   On: 01/10/2022 21:08   DG Ribs Unilateral Right  Result Date: 01/10/2022 CLINICAL DATA:  Fall, rib pain EXAM: RIGHT RIBS - 2 VIEW COMPARISON:  Chest x-ray today FINDINGS: No fracture or other bone lesions are seen involving the ribs. Visualized lungs clear. No effusions or pneumothorax. IMPRESSION: No visible rib fracture. Electronically Signed   By: Charlett Nose M.D.   On: 01/10/2022 21:09    Procedures Procedures    Medications Ordered in ED Medications - No data to display  ED Course/ Medical Decision Making/ A&P                            Medical Decision Making Amount and/or Complexity of Data Reviewed Radiology: ordered.   Patient is a 51 year old male with no significant past medical history presents to the emergency department complaining of right-sided chest wall pain after a fall.  No head trauma or LOC. It was reported patient had "too much to drink" and fell down some steps.   On my exam patient has tenderness to palpation of the right chest wall, with no instability.  He has good air movement bilaterally, with good oxygen saturation.  Chest x-ray performed showed no rib fractures or acute cardiopulmonary abnormalities.  I agree with the radiologist interpretation.  After evaluation of the patient symptoms and imaging, I do not believe he is requiring admission or inpatient treatment for his symptoms.  Patient is medically cleared for transport to jail as he is being arrested this evening.  Filled out necessary paperwork with the police department.  Advised patient that we will treat his pain symptomatically with over-the-counter medications.  We discussed reasons to return to the emergency department, and patient is agreeable to the plan.  Final Clinical Impression(s) / ED Diagnoses Final diagnoses:  Fall, initial encounter  Rib pain on right side    Rx / DC Orders ED Discharge Orders     None      Portions of this report may have been transcribed using voice recognition software. Every effort was made to ensure accuracy; however, inadvertent computerized transcription errors may be present.    Jeanella Flattery 01/10/22 2258    Mancel Bale, MD 01/11/22 [phone]

## 2022-01-10 NOTE — ED Provider Triage Note (Signed)
Emergency Medicine Provider Triage Evaluation Note  Edward Powers , a 51 y.o. male  was evaluated in triage.  Pt is accompanied by police.  Complains of right rib pain after an injury earlier today.  Patient states that he fell down stairs landing on his right rib.  He has severe sharp pain worse with inspiration.  He denies head injury or loss of consciousness.  No treatment prior to arrival.  He has no other complaints  Review of Systems  Positive: Right rib pain Negative: As above  Physical Exam  BP (!) 183/108 (BP Location: Left Arm)    Pulse (!) 101    Temp 98 F (36.7 C) (Oral)    Resp 19    Ht 5\' 6"  (1.676 m)    Wt 84 kg    SpO2 98%    BMI 29.89 kg/m  Gen:   Awake, no distress   Resp:  Normal effort  MSK:   Moves extremities without difficulty  Other:  There is a small abrasion to the right cheekbone without periorbital swelling, bruising, negative battle sign, negative raccoon sign. There is exquisite tenderness of the lateral rib cage without palpable deformity  Medical Decision Making  Medically screening exam initiated at 8:51 PM.  Appropriate orders placed.  Maverik Foot was informed that the remainder of the evaluation will be completed by another provider, this initial triage assessment does not replace that evaluation, and the importance of remaining in the ED until their evaluation is complete.     Simone Curia, Janell Quiet 01/10/22 2100
